# Patient Record
Sex: Female | Born: 1940 | Race: White | Hispanic: No | Marital: Married | Smoking: Never smoker
Health system: Southern US, Community
[De-identification: ages and names within clinical notes are randomized; demographics above are authoritative.]

## PROBLEM LIST (undated history)

## (undated) DIAGNOSIS — R251 Tremor, unspecified: Secondary | ICD-10-CM

## (undated) DIAGNOSIS — I252 Old myocardial infarction: Secondary | ICD-10-CM

## (undated) DIAGNOSIS — I2510 Atherosclerotic heart disease of native coronary artery without angina pectoris: Secondary | ICD-10-CM

## (undated) DIAGNOSIS — E785 Hyperlipidemia, unspecified: Secondary | ICD-10-CM

## (undated) HISTORY — PX: TRIGGER FINGER RELEASE: SHX641

## (undated) HISTORY — PX: HX CATARACT REMOVAL: SHX102

## (undated) HISTORY — DX: Tremor, unspecified: R25.1

## (undated) HISTORY — DX: Old myocardial infarction: I25.2

## (undated) HISTORY — DX: Atherosclerotic heart disease of native coronary artery without angina pectoris: I25.10

## (undated) HISTORY — DX: Hyperlipidemia, unspecified: E78.5

---

## 2006-09-29 DIAGNOSIS — I2510 Atherosclerotic heart disease of native coronary artery without angina pectoris: Secondary | ICD-10-CM

## 2006-09-29 HISTORY — DX: Atherosclerotic heart disease of native coronary artery without angina pectoris: I25.10

## 2007-04-30 HISTORY — PX: LEFT HEART CATH: SHX5946

## 2007-05-13 ENCOUNTER — Inpatient Hospital Stay (HOSPITAL_COMMUNITY): Admission: EM | Admit: 2007-05-13 | Discharge: 2007-05-15 | Payer: Self-pay | Admitting: Cardiology

## 2007-05-13 ENCOUNTER — Ambulatory Visit: Payer: Self-pay | Admitting: *Deleted

## 2007-06-10 ENCOUNTER — Ambulatory Visit: Payer: Self-pay | Admitting: Cardiology

## 2007-07-06 ENCOUNTER — Ambulatory Visit: Payer: Self-pay | Admitting: Cardiovascular Disease

## 2007-07-06 LAB — CONVERTED CEMR LAB
Bilirubin, Direct: 0.2 mg/dL (ref 0.0–0.3)
HDL: 46.1 mg/dL (ref >39.0)
LDL Cholesterol: 70 mg/dL (ref 0–99)
Total CHOL/HDL Ratio: 2.7
Total Protein: 6.4 g/dL (ref 6.0–8.3)
Triglycerides: 34 mg/dL (ref 0–149)
VLDL: 7 mg/dL (ref 0–40)

## 2008-01-13 ENCOUNTER — Ambulatory Visit: Payer: Self-pay | Admitting: Cardiovascular Disease

## 2008-02-09 IMAGING — CT CT ANGIO CHEST
2 of 7 series · 17 of 36 positions shown · IV contrast (omnipaque)
Comparison: none

CLINICAL DATA: 65-year-old female with chest pain and back pain. 
 CT ANGIOGRAPHY OF CHEST:
TECHNIQUE: Multidetector CT imaging of the chest was performed during bolus injection of intravenous contrast.  Multiplanar CT angiographic image reconstructions were generated to evaluate the vascular anatomy.
 Contrast:  70 mL Omnipaque 300.

[Series 6: pulm embolism 2.0 cor · coronal · 0.64mm/px · 1 of 98 slices shown]
[im 49/98  mediastinal]
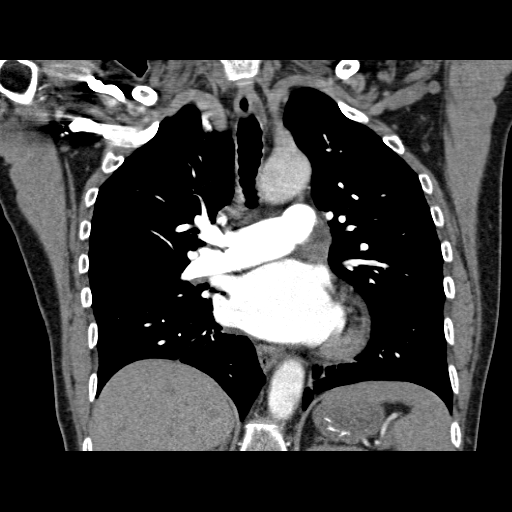

[Series 10: pulm embolism 1.0 thins · axial · 0.55mm/px · z∈[-262,-40]mm · 16 of 253 slices shown]
[im 15/253  lung]
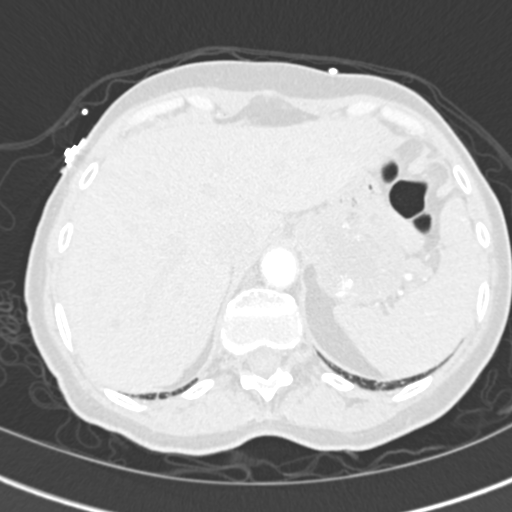
[im 30/253  mediastinal]
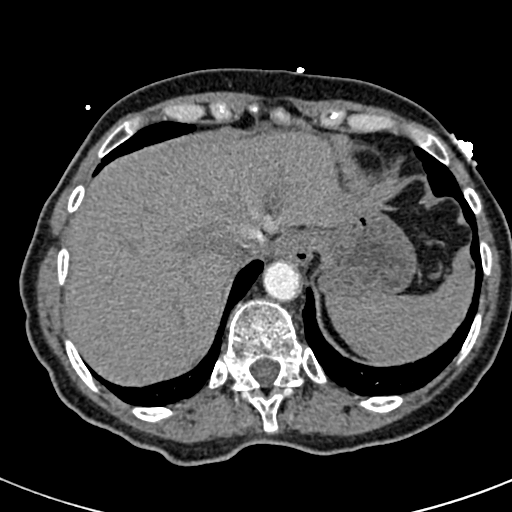
[im 45/253  lung]
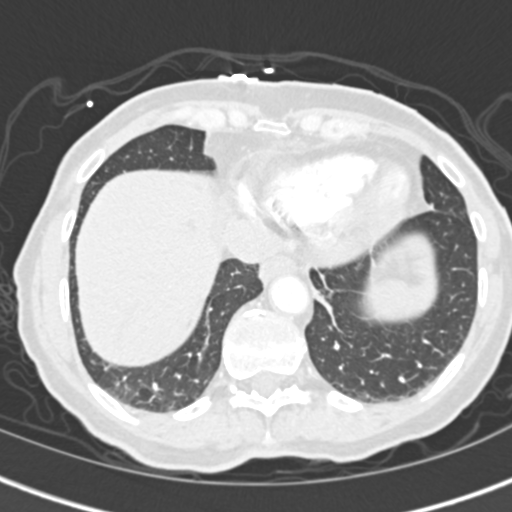
[im 60/253  mediastinal]
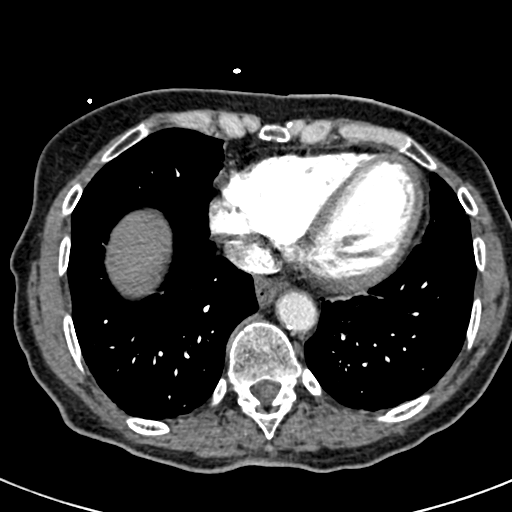
[im 75/253  lung]
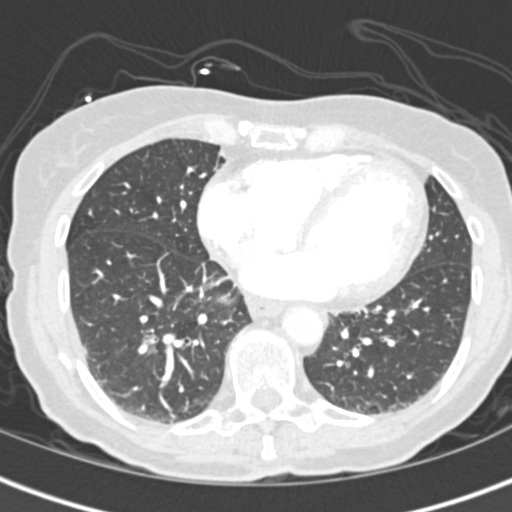
[im 89/253  mediastinal]
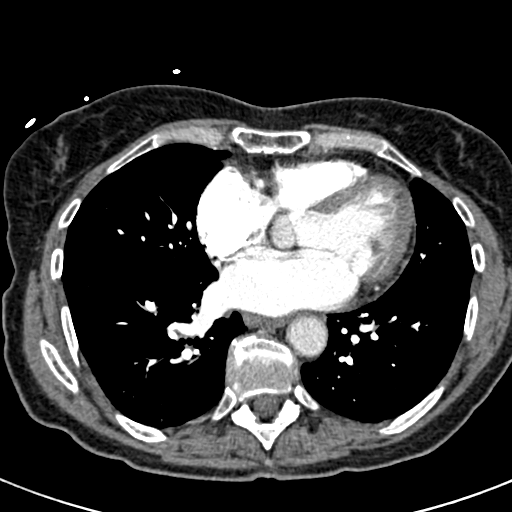
[im 104/253  lung]
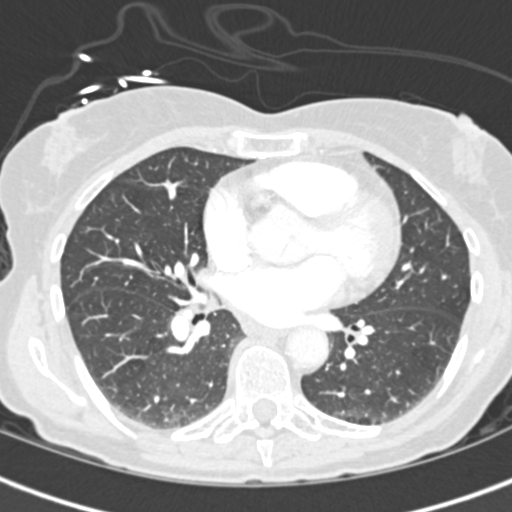
[im 119/253  mediastinal]
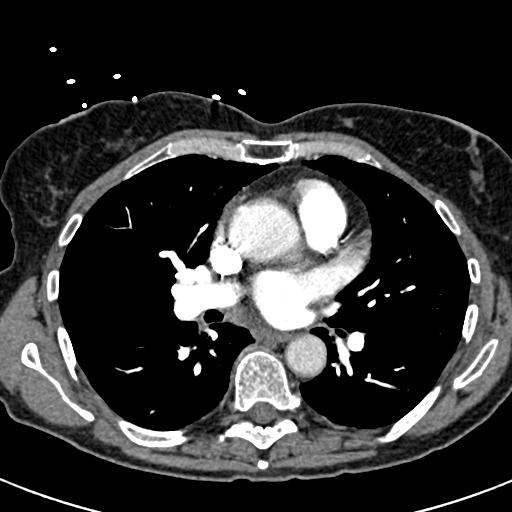
[im 134/253  lung]
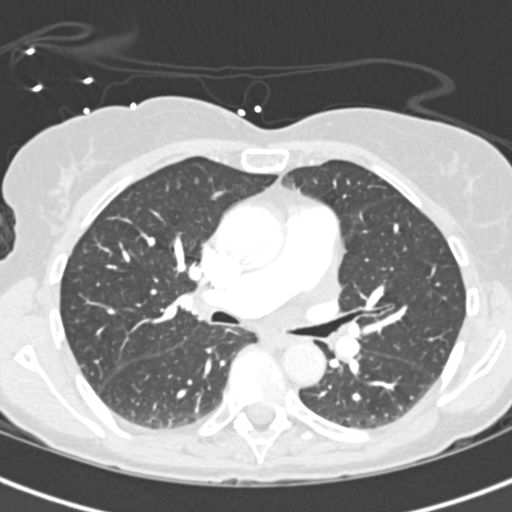
[im 149/253  mediastinal]
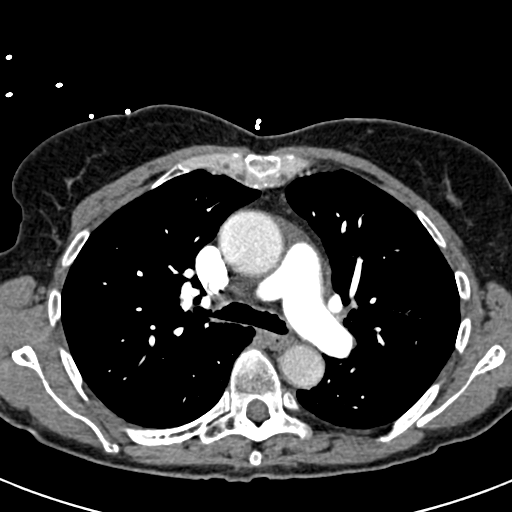
[im 164/253  lung]
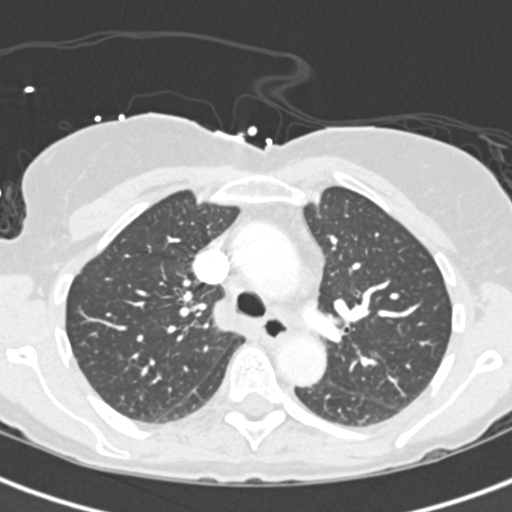
[im 178/253  mediastinal]
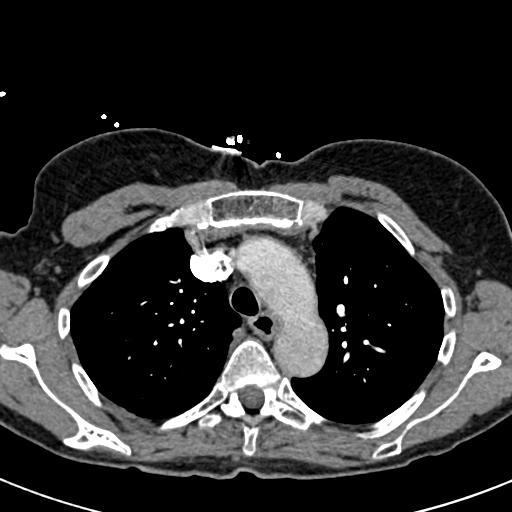
[im 193/253  lung]
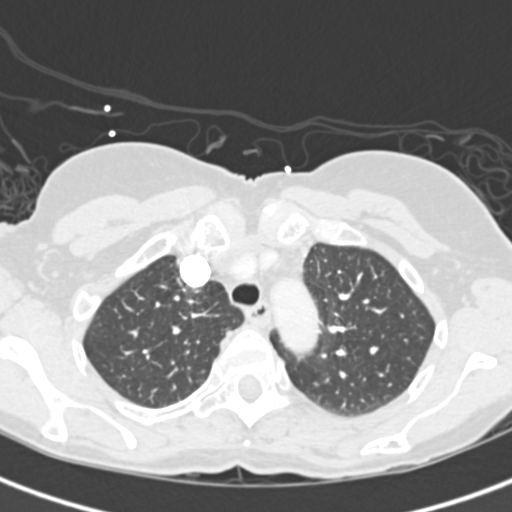
[im 208/253  mediastinal]
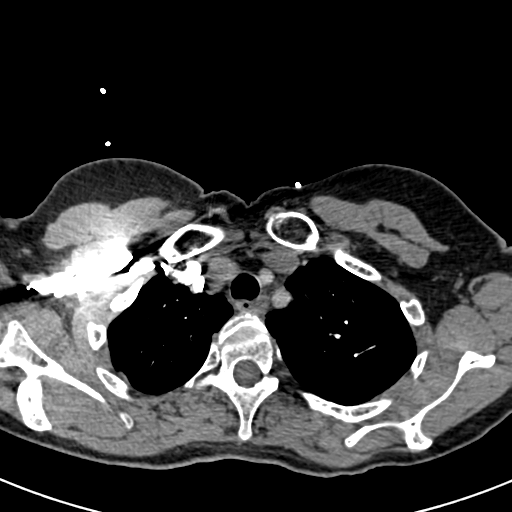
[im 223/253  lung]
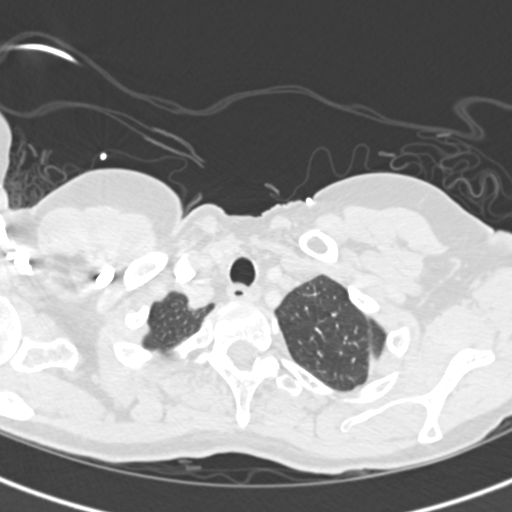
[im 238/253  mediastinal]
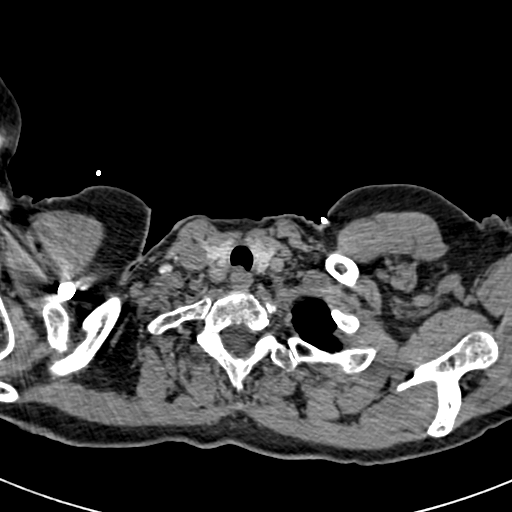

[17 of 36 positions shown; findings below may reference images not displayed]

FINDINGS: There is excellent opacification of the pulmonary arterial system. No focal filling defects are present to suggest pulmonary emboli. The aorta is unremarkable.  The heart size is normal. There is no significant pleural or pericardial effusion. Incidental imaging of the upper abdomen demonstrates two cystic lesions within the right lobe of the liver. No other focal abnormalities are present.
 Lung Windows:  A 3 mm left upper lobe nodule is present. Minimal dependent atelectasis is present. There are no other focal nodules, mass lesions, or airspace disease.
 Bone windows demonstrate no focal lytic or blastic lesions.
IMPRESSION: 1.  No pulmonary embolus.
 2.  3 mm left upper lobe nodule. No further follow-up is necessary. 
 3.  Benign appearing cystic lesions of the right lobe of the liver.

## 2008-07-26 ENCOUNTER — Ambulatory Visit: Payer: Self-pay | Admitting: Cardiovascular Disease

## 2008-07-26 LAB — CONVERTED CEMR LAB
ALT: 16 units/L (ref 0–35)
AST: 28 units/L (ref 0–37)
Cholesterol: 163 mg/dL (ref 0–200)
HDL: 58.4 mg/dL (ref >39.0)
Total Protein: 6.5 g/dL (ref 6.0–8.3)
Triglycerides: 51 mg/dL (ref 0–149)
VLDL: 10 mg/dL (ref 0–40)

## 2009-02-16 ENCOUNTER — Telehealth: Payer: Self-pay | Admitting: Cardiovascular Disease

## 2009-07-26 DIAGNOSIS — I252 Old myocardial infarction: Secondary | ICD-10-CM

## 2009-07-26 DIAGNOSIS — M810 Age-related osteoporosis without current pathological fracture: Secondary | ICD-10-CM | POA: Insufficient documentation

## 2009-07-26 DIAGNOSIS — E785 Hyperlipidemia, unspecified: Secondary | ICD-10-CM

## 2009-07-26 DIAGNOSIS — I2510 Atherosclerotic heart disease of native coronary artery without angina pectoris: Secondary | ICD-10-CM | POA: Insufficient documentation

## 2009-07-31 ENCOUNTER — Ambulatory Visit: Payer: Self-pay | Admitting: Cardiovascular Disease

## 2009-08-16 ENCOUNTER — Ambulatory Visit: Payer: Self-pay | Admitting: Cardiovascular Disease

## 2009-08-29 LAB — CONVERTED CEMR LAB
Bilirubin, Direct: 0 mg/dL (ref 0.0–0.3)
Cholesterol: 139 mg/dL (ref 0–200)
HDL: 50.6 mg/dL (ref >39.00)
LDL Cholesterol: 80 mg/dL (ref 0–99)
Total Bilirubin: 0.7 mg/dL (ref 0.3–1.2)
Total CHOL/HDL Ratio: 3
Total Protein: 6.5 g/dL (ref 6.0–8.3)
Triglycerides: 43 mg/dL (ref 0.0–149.0)

## 2009-11-29 ENCOUNTER — Telehealth (INDEPENDENT_AMBULATORY_CARE_PROVIDER_SITE_OTHER): Payer: Self-pay | Admitting: *Deleted

## 2010-02-19 ENCOUNTER — Telehealth (INDEPENDENT_AMBULATORY_CARE_PROVIDER_SITE_OTHER): Payer: Self-pay | Admitting: *Deleted

## 2010-04-22 ENCOUNTER — Telehealth: Payer: Self-pay | Admitting: Cardiovascular Disease

## 2010-09-10 ENCOUNTER — Ambulatory Visit: Payer: Self-pay | Admitting: Cardiovascular Disease

## 2010-09-10 ENCOUNTER — Encounter: Payer: Self-pay | Admitting: Cardiovascular Disease

## 2010-09-17 ENCOUNTER — Ambulatory Visit: Payer: Self-pay | Admitting: Cardiovascular Disease

## 2010-09-27 DIAGNOSIS — E7800 Pure hypercholesterolemia, unspecified: Secondary | ICD-10-CM

## 2010-09-30 LAB — CONVERTED CEMR LAB
ALT: 13 units/L (ref 0–35)
Bilirubin, Direct: 0.1 mg/dL (ref 0.0–0.3)
HDL: 48 mg/dL (ref >39.00)
Total Bilirubin: 0.7 mg/dL (ref 0.3–1.2)
Total CHOL/HDL Ratio: 3
VLDL: 7.2 mg/dL (ref 0.0–40.0)

## 2010-10-01 ENCOUNTER — Encounter: Payer: Self-pay | Admitting: Cardiovascular Disease

## 2010-10-29 NOTE — Progress Notes (Signed)
Summary: questions re medication   Phone Note Call from Patient   Caller: Patient Reason for Call: Talk to Nurse Summary of Call: pt wants to know if there is a problem with taking the simvastatin and drinking grapefruit juice-pls call [phone] Initial call taken by: Glynda Jaeger,  April 22, 2010 11:08 AM  Follow-up for Phone Call        I made the pt aware that there is an interaction between grapefruit juice and simvastatin.  I told the pt that you cannot take simvastatin at the same time as drinking grapefruit juice.  The pt also cannot consume more than 1 quart of grapefruit juice in a day.   Follow-up by: Julieta Gutting, RN, BSN,  April 22, 2010 4:41 PM

## 2010-10-29 NOTE — Progress Notes (Signed)
Summary: refill**[facility]**  Phone Note Refill Request Message from:  Patient on Feb 19, 2010 1:29 PM  Refills Requested: Medication #1:  SIMVASTATIN 40 MG TABS Take one tablet by mouth daily at bedtime   Supply Requested: 3 months   Notes: 90 day supply [facility] [phone] fax [phone]   Method Requested: Fax to Mail Away Pharmacy Initial call taken by: Migdalia Dk,  Feb 19, 2010 1:30 PM  Follow-up for Phone Call        Rx faxed to pharmacy Follow-up by: Vikki Ports,  Feb 19, 2010 3:23 PM    Prescriptions: SIMVASTATIN 40 MG TABS (SIMVASTATIN) Take one tablet by mouth daily at bedtime  #90 Tablet x 4   Entered by:   Vikki Ports   Authorized by:   Norva Karvonen, MD   Signed by:   Vikki Ports on 02/19/2010   Method used:   Faxed to ...       [facility] (mail[facility]order)             , [place]         Ph:        Fax: [phone]   RxID:   [id number]

## 2010-10-29 NOTE — Progress Notes (Signed)
Summary: REFILL  Phone Note Refill Request Message from:  Patient on November 29, 2009 10:30 AM  Refills Requested: Medication #1:  METOPROLOL TARTRATE 25 MG TABS take 1/2 tablet two times a day [facility] [phone] FAX # [phone]  Initial call taken by: Judie Grieve,  November 29, 2009 10:31 AM    Prescriptions: METOPROLOL TARTRATE 25 MG TABS (METOPROLOL TARTRATE) take 1/2 tablet two times a day  #90 x 3   Entered by:   Oswald Hillock   Authorized by:   Norva Karvonen, MD   Signed by:   Oswald Hillock on 11/29/2009   Method used:   Faxed to ...       [facility] (mail-order)       [address]       [place], [place]  [address]       Ph: [phone]       Fax: [phone]   RxID:   [id number]

## 2010-10-31 NOTE — Letter (Signed)
Summary: Custom - Lipid  [facility], Main Office  [address]   [place], [place] [address]   Phone: [phone]  Fax: [phone]     October 01, 2010 MRN: [id number]   Andrea Frost [address] [place], [place]  [address]   Dear Andrea Frost,  We have reviewed your cholesterol results.  They are as follows:     Total Cholesterol:    133 (Desirable: less than 200)       HDL  Cholesterol:     48.00  (Desirable: greater than 40 for men and 50 for women)       LDL Cholesterol:       78  (Desirable: less than 100 for low risk and less than 70 for moderate to high risk)       Triglycerides:       36.0  (Desirable: less than 150)  Our recommendations include: Your cholesterol and liver function results are normal.  Please continue your current medications.   Call our office at the number listed above if you have any questions.  Lowering your LDL cholesterol is important, but it is only one of a large number of "risk factors" that may indicate that you are at risk for heart disease, stroke or other complications of hardening of the arteries.  Other risk factors include:   A.  Cigarette Smoking* B.  High Blood Pressure* C.  Obesity* D.   Low HDL Cholesterol (see yours above)* E.   Diabetes Mellitus (higher risk if your is uncontrolled) F.  Family history of premature heart disease G.  Previous history of stroke or cardiovascular disease    *These are risk factors YOU HAVE CONTROL OVER.  For more information, visit .  There is now evidence that lowering the TOTAL CHOLESTEROL AND LDL CHOLESTEROL can reduce the risk of heart disease.  The American Heart Association recommends the following guidelines for the treatment of elevated cholesterol:  1.  If there is now current heart disease and less than two risk factors, TOTAL CHOLESTEROL should be less than 200 and LDL CHOLESTEROL should be less than 100. 2.  If there is current heart disease or two or more  risk factors, TOTAL CHOLESTEROL should be less than 200 and LDL CHOLESTEROL should be less than 70.  A diet low in cholesterol, saturated fat, and calories is the cornerstone of treatment for elevated cholesterol.  Cessation of smoking and exercise are also important in the management of elevated cholesterol and preventing vascular disease.  Studies have shown that 30 to 60 minutes of physical activity most days can help lower blood pressure, lower cholesterol, and keep your weight at a healthy level.  Drug therapy is used when cholesterol levels do not respond to therapeutic lifestyle changes (smoking cessation, diet, and exercise) and remains unacceptably high.  If medication is started, it is important to have you levels checked periodically to evaluate the need for further treatment options.  Thank you,  Julieta Gutting RN [facility] Team

## 2010-10-31 NOTE — Assessment & Plan Note (Signed)
Summary: yearly   Visit Type:  1 year follow up  CC:  No cardiac complaints.  History of Present Illness: This is a 70 year old woman who presented in 2008 with an acute coronary syndrome. She was found to have nonobstructive CAD a cardiac catheterization was treated medically.   She has no complaints today. Specifically denies chest pain, dyspnea, edema, palpitations, lightheadedness, or syncope. She walks her dog for exercise at least a few times daily without exertional symptoms.   Current Medications (verified): 1)  Metoprolol Tartrate 25 Mg Tabs (Metoprolol Tartrate) .... Take 1/2 Tablet Two Times A Day 2)  Aspirin Ec 325 Mg Tbec (Aspirin) .... Take One Tablet By Mouth Daily 3)  Simvastatin 40 Mg Tabs (Simvastatin) .... Take One Tablet By Mouth Daily At Bedtime 4)  Calcium Carbonate-Vitamin D 600-400 Mg-Unit  Tabs (Calcium Carbonate-Vitamin D) .... Take 1 Tablet By Mouth Two Times A Day 5)  Nitrostat 0.4 Mg Subl (Nitroglycerin) .Marland Kitchen.. 1 Tablet Under Tongue At Onset of Chest Pain; You May Repeat Every 5 Minutes For Up To 3 Doses. 6)  Alendronate Sodium 70 Mg Tabs (Alendronate Sodium) .... Once A Week 7)  Triamcinolone Acetonide 0.1 % Crea (Triamcinolone Acetonide) .... Use As Directed Daily  Allergies: 1)  ! * Epinephrine  Past History:  Past medical history reviewed for relevance to current acute and chronic problems.  Past Medical History: Reviewed history from 07/31/2009 and no changes required. Nonobstructive CAD, but presentation with ACS in 2008. Dyslipidemia Osteoporosis  Review of Systems       Negative except as per HPI   Vital Signs:  Patient profile:   70 year old female Height:      66 inches Weight:      136 pounds BMI:     22.03 Pulse rate:   59 / minute Pulse rhythm:   regular Resp:     18 per minute BP sitting:   114 / 74  (left arm) Cuff size:   regular  Vitals Entered By: Vikki Ports (September 10, 2010 1:59 PM)  Physical Exam  General:  Pt  is alert and oriented, in no acute distress. HEENT: normal Neck: normal carotid upstrokes without bruits, JVP normal Lungs: CTA CV: RRR without murmur or gallop Abd: soft, NT, positive BS, no bruit, no organomegaly Ext: no clubbing, cyanosis, or edema. peripheral pulses 2+ and equal Skin: warm and dry without rash    EKG  Procedure date:  09/10/2010  Findings:      NSR with nonspecific ST-T changes, 59 bpm  Impression & Recommendations:  Problem # 1:  CAD (ICD-414.00)  No recurrent angina. Continue ASA, beta-blocker, and statin. Encouraged continued exercise.  Her updated medication list for this problem includes:    Metoprolol Tartrate 25 Mg Tabs (Metoprolol tartrate) .Marland Kitchen... Take 1/2 tablet two times a day    Aspirin Ec 325 Mg Tbec (Aspirin) .Marland Kitchen... Take one tablet by mouth daily    Nitrostat 0.4 Mg Subl (Nitroglycerin) .Marland Kitchen... 1 tablet under tongue at onset of chest pain; you may repeat every 5 minutes for up to 3 doses.  Orders: EKG w/ Interpretation (93000)  Problem # 2:  DYSLIPIDEMIA (ICD-272.4) Lipids have been at goal. Due for followup labs - will order.  Her updated medication list for this problem includes:    Simvastatin 40 Mg Tabs (Simvastatin) .Marland Kitchen... Take one tablet by mouth daily at bedtime  CHOL: 139 (08/16/2009)   LDL: 80 (08/16/2009)   HDL: 50.60 (08/16/2009)   TG: 43.0 (08/16/2009)  Patient Instructions: 1)  Your physician recommends that you schedule a follow-up appointment in: 12 months 2)  Your physician recommends that you return for a FASTING lipid profile and liver profile next week 3)  Your physician recommends that you continue on your current medications as directed. Please refer to the Current Medication list given to you today.

## 2010-12-03 ENCOUNTER — Telehealth: Payer: Self-pay | Admitting: Cardiovascular Disease

## 2010-12-10 NOTE — Progress Notes (Signed)
Summary: Metoprolol Tartrate 25mg   Phone Note Refill Request Call back at Home Phone [phone] Message from:  Patient  Refills Requested: Medication #1:  METOPROLOL TARTRATE 25 MG TABS take 1/2 tablet two times a day please send in a 90day supply [facility]  Initial call taken by: Omer Jack,  December 03, 2010 3:59 PM    Prescriptions: METOPROLOL TARTRATE 25 MG TABS (METOPROLOL TARTRATE) take 1/2 tablet two times a day  #90 x 3   Entered by:   Celestia Khat, CMA   Authorized by:   Norva Karvonen, MD   Signed by:   Celestia Khat, CMA on 12/03/2010   Method used:   Faxed to ...       [facility] (mail-order)             , [place]         Ph:        Fax: [phone]   RxID:   [id number]

## 2011-02-11 NOTE — Cardiovascular Report (Signed)
NAME:  Andrea Frost, POSNER NO.:  [id number]   MEDICAL RECORD NO.:  [id number]          PATIENT TYPE:  INP   LOCATION:  3706                         FACILITY:  [facility]   PHYSICIAN:  Veverly Fells. Excell Seltzer, MD  DATE OF BIRTH:  [birth year]   DATE OF PROCEDURE:  05/14/2007  DATE OF DISCHARGE:  05/15/2007                            CARDIAC CATHETERIZATION   PROCEDURE:  1. Left heart catheterization.  2. Selective coronary angiography.  3. Left ventricular angiography.  4. StarClose of the right femoral artery.   INDICATIONS:  Andrea Frost is a 70 year old woman who presented with  chest and back pain.  She has not had any cardiac history or problems in  the past.  She was given nitroglycerin with relief of her symptoms.  She  had increased D-dimer as well as an increased troponin of 0.4.  She was  ruled out for pulmonary embolus with a CTPE protocol.  She is referred  for cardiac catheterization in the setting of her small cardiac  biomarker rise.  She had been painfree since her initial presentation.   DESCRIPTION OF PROCEDURE:  Risks and indications of procedure were  explained to the patient.  Informed consent was obtained.  The right  groin was prepped, draped, anesthetized with 1% lidocaine sent lidocaine  using modified Seldinger technique.  A 6-French sheath was placed in the  right femoral artery.  Multiple views of the right and left coronary  arteries were taken.  Following selective coronary angiography, an  angled pigtail catheter was inserted and left ventricle and pressures  were recorded.  Left ventriculogram was performed.  A pull-back across  the aortic valve was done at the completion of the procedure.  StarClose  device was used to seal the femoral arteriotomy.   FINDINGS:  Aortic pressure is 128/60 with a mean of 88.  Left  ventricular pressure is 125/70   CORONARY ANGIOGRAPHY:  The left mainstem is mildly calcified,  angiographically normal.  It  bifurcates into the LAD and left  circumflex.  The LAD has minor ostial stenosis.  There is some  calcification throughout the proximal LAD as well.  The estimated  stenosis is 30-40%.  The LAD is a small vessel after its mid portion.  There is a twin that divides into two vessels that has essentially  represented twin LAD that reaches the mid and mid-to-distal portion of  the anterior wall.  There is a first diagonal branch that is also a  small vessel.   The left circumflex is a large vessel.  There are minor luminal  irregularities in the proximal portion.  It courses down and provides a  medium-size first OM branch and then three small posterolateral  branches.  There is no significant angiographic disease in the left  circumflex system.  The right coronary artery is a large vessel.  It is  dominant and reaches the left ventricular apex.  The proximal portion of  the vessel has minor luminal irregularities of approximately 30%.  The  mid portion of the vessel is angiographically normal and distally there  is a focal 50% lesion.  The entire right coronary artery has mild  diffuse calcium.  The PDA branch has no significant angiographic  disease.  There is a very small posterior AV segment branch.    Left ventriculography shows normal left ventricular systolic function  with an left ventricular ejection fraction estimated at 55%.   ASSESSMENT:  1. Minor nonobstructive left anterior descending stenosis.  2. Mild to moderate right coronary artery stenosis.  3. Normal left circumflex.  4. Normal left ventricular function.   PLAN:  Andrea Frost does not have any high-grade stenoses.  Recommend  aggressive medical therapy with aspirin and lipid lowering.  Will start  her on 40 mg of Lipitor daily.  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  If she has any  recurrence, I would favor treating her as such with a calcium channel   blocker.      Veverly Fells. Excell Seltzer, MD  Electronically Signed     MDC/MEDQ  D:  05/14/2007  T:  05/15/2007  Job:  [id number]

## 2011-02-11 NOTE — Assessment & Plan Note (Signed)
[facility]                            CARDIOLOGY OFFICE NOTE   HILA, BOLDING                     MRN:          [id number]  DATE:07/06/2007                            DOB:          [birth year]    Andrea Frost returns for cardiology followup at the [facility] office  on July 06, 2007. She is a delightful 70 year old woman who presented  in mid August with a non-ST elevation myocardial infarction. She also  had an elevated d-Dimer at that time and a CT scan of the chest was  performed which was negative for pulmonary embolus. She ultimately  underwent a left heart catheterization with coronary angiography that  demonstrated non-obstructive coronary artery disease. She had non-  obstructive disease of the LAD with 30-40% stenosis through the proximal  vessel. The left circumflex had minor luminal irregularities and the  right coronary artery had mild diffuse disease as well. Her LV EF was  normal at 55%. She has responded well to medical therapy and is  currently asymptomatic. She specifically denies chest pain, dyspnea,  palpitations or lightheadedness.   CURRENT MEDICATIONS:  1. Aspirin 325 mg daily.  2. Lipitor 40 mg daily.  3. Toprol XL 25 mg daily.  4. Actonel daily.   PHYSICAL EXAMINATION:  She is alert, oriented and in no acute distress.  She is a healthy-appearing woman. Weight is 128, blood pressure 116/68,  heart rate 60, respiratory rate is 12.  HEENT: Is normal.  NECK: Normal carotid upstrokes without bruits. Jugular venous pressure  is normal.  LUNGS:  Clear to auscultation bilaterally.  CARDIOVASCULAR: The heart is regular rate and rhythm without murmurs or  gallops.  ABDOMEN: Soft and nontender. No organomegaly. No abdominal bruits.  EXTREMITIES: No clubbing, cyanosis or edema. Peripheral pulses are 2+  and equal throughout.   ASSESSMENT:  1. Non-obstructive coronary artery disease with recent non-ST      elevation  myocardial infarction. Continue with medical therapy. She      is tolerating her current regimen with no side effects. Long      discussion today regarding longterm secondary risk reduction and      rationale for ongoing medical therapy with statin, aspirin and beta-      blockade.  2. Hyperlipidemia. Will followup with lipids and LFTs. She is      scheduled to have labs drawn today.  3. Osteoporosis. She is followed by her primary care physician and      continues with Actonel.   For followup, I would like to see Andrea Frost back in six months. I  would be happy to see her back sooner if any problems arise.     Veverly Fells. Excell Seltzer, MD  Electronically Signed    MDC/MedQ  DD: 07/06/2007  DT: 07/06/2007  Job #: [id number]

## 2011-02-11 NOTE — Assessment & Plan Note (Signed)
[facility]                            CARDIOLOGY OFFICE NOTE   Andrea Frost, Andrea Frost                     MRN:          [id number]  DATE:01/13/2008                            DOB:          [birth year]    HISTORY:  Andrea Frost was seen in followup at the [facility] office on January 13, 2008.  She is a delightful 70 year old  woman who presented with a non-ST-elevation MI last summer.  Catheterization showed nonobstructive CAD with preserved LVEF of 55%.  She has continued to be asymptomatic.  She specifically denies chest  pain, dyspnea, palpitations, lightheadedness or syncope.  She has been  active and walks approximately 30 minutes daily with her dog.   MEDICATIONS:  1. Aspirin 325 mg daily.  2. Actonel 150 mg monthly.  3. Simvastatin 40 mg daily.  4. Metoprolol 12.5 mg twice daily.  5. Calcium Plus D 600 mg twice daily.   PHYSICAL EXAMINATION:  GENERAL:  She is alert and oriented in no  distress.  VITAL SIGNS:  Weight 138 pounds, blood pressure 123/75, heart rate 63,  respiratory rate 16.  HEENT:  Normal.  NECK:  Normal carotid upstrokes without bruits.  Jugular venous pressure  is normal.  LUNGS:  Clear bilaterally.  HEART:  Regular rate and rhythm without murmurs or gallops.  ABDOMEN:  Soft, nontender.  No organomegaly.  No bruits.  EXTREMITIES:  No clubbing, cyanosis or edema.  Peripheral pulses are 2+  and equal throughout.   DIAGNOSTICS:  EKG shows sinus rhythm with inferior ST/T wave changes  consistent with ischemia and also nonspecific anterolateral ST/T wave  changes.  EKG is unchanged from previous.   ASSESSMENT:  1. History of non-ST elevation myocardial infarction with mild      nonobstructive coronary artery disease.  Continue medical therapy      with aspirin, simvastatin and metoprolol.  No symptoms of angina at      present.  2. Dyslipidemia.  Lipids are under excellent control on simvastatin.      In  October 2008,  the cholesterol was 123, HDL 46, LDL 70.      Continue current therapy with repeat labs in October 2009.   FOLLOW UP:  I would like to see Andrea Frost back in 1 year.  I would be  happy to see her sooner if any problems arise.     Veverly Fells. Excell Seltzer, MD  Electronically Signed    MDC/MedQ  DD: 01/13/2008  DT: 01/13/2008  Job #: [id number]

## 2011-02-11 NOTE — Assessment & Plan Note (Signed)
[facility]                            CARDIOLOGY OFFICE NOTE   Andrea Frost, Andrea Frost                     MRN:          [id number]  DATE:06/10/2007                            DOB:          [birth year]    PRIMARY CARDIOLOGIST:  Veverly Fells. Excell Seltzer, M.D.   PATIENT PROFILE:  A 70 year old Caucasian female with recent non-ST-  elevation MI and nonobstructive coronary disease on catheterization who  presents for hospital followup.   PROBLEM LIST:  1. Coronary artery disease.      a.     Status post non-ST-elevation myocardial infarction May 13, 2007.      b.     May 14, 2007, cardiac catheterization revealing       nonobstructive disease with an ejection fraction of 55%. The       patient was medically managed.  2. Hyperlipidemia.  3. History of 3-mm left upper lobe nodule noted on chest CT during      hospitalization August 2008.  4. Osteoporosis.   HISTORY OF PRESENT ILLNESS:  A 70 year old Caucasian female who  presented to [facility] August 14 complaining of back and chest pain.  She ruled in for non-ST-elevation MI. She also had elevated D-dimer with  negative chest CT for PE. Secondary to elevated cardiac markers, she  underwent cardiac catheterization August 15 which showed nonobstructive  coronary artery disease with normal LV function. She was subsequently  discharge August 16 on aspirin, statin and beta blocker therapy, all of  which she has tolerated. She has been active, walking approximately 30  minutes a day, without any recurrent chest discomfort or limitations in  activities. She denies any dyspnea. She had multiple questions regarding  the usage of statin therapy as well as management of a patient with non-  ST-elevation MI and nonobstructive disease.   HOME MEDICATIONS:  1. Aspirin 325 mg daily.  2. Lipitor 40 mg daily.  3. Toprol-XL 25 mg daily.  4. Actonel every week.  5. Nitroglycerin p.r.n.   PHYSICAL  EXAMINATION:  Blood pressure 102/68, heart rate 59,  respirations 16. She is afebrile. Weight is 134 pounds.  Pleasant white female in no acute distress. Awake, alert and oriented  x3.  NECK:  No bruits or JVD.  LUNGS:  Respirations are regular and unlabored. Clear to auscultation.  CARDIAC:  Regular S1 and S2. No S3, S4 or murmurs.  ABDOMEN:  Round, soft, nontender, nondistended. Bowel sounds present x4.  EXTREMITIES:  Warm, dry and pink. No clubbing, cyanosis, or edema.  Dorsalis pedis and posterior tibialis pulses 2+ and equal bilaterally.  The right groin site which was previously used for catheterization is  clear of bleeding, bruising, hematoma.   ACCESSORY CLINICAL FINDINGS:  None.   ASSESSMENT AND PLAN:  1. Nonobstructive coronary artery disease status post non-ST-elevation      myocardial infarction. The patient is doing well on medical      management. She is tolerating her aspirin, beta blocker and statin      just fine. She is encouraged to continue taking her medications,  and we discussed indications for beta blocker, statin and aspirin      following non-ST-elevation myocardial infarction. She has followup      with Dr. Excell Seltzer scheduled for October 7, and as the statin is new      for her, we will plan to check LFTs at that appointment.  2. Hyperlipidemia. Follow up lipids and LFTs in October. Continue      statin therapy.  3. Osteoporosis. Followed by primary care. She is Actonel therapy.   DISPOSITION:  Follow up with Dr. Excell Seltzer next month.      Nicolasa Ducking, ANP  Electronically Signed      Arturo Morton. Riley Kill, MD, [facility]  Electronically Signed   CB/MedQ  DD: 06/10/2007  DT: 06/11/2007  Job #: [id number]

## 2011-02-11 NOTE — H&P (Signed)
NAME:  Andrea Frost, Andrea Frost NO.:  [id number]   MEDICAL RECORD NO.:  [id number]          PATIENT TYPE:  INP   LOCATION:  6531                         FACILITY:  [facility]   PHYSICIAN:  Elmore Guise., M.D.DATE OF BIRTH:  [birth year]   DATE OF ADMISSION:  05/13/2007  DATE OF DISCHARGE:                              HISTORY & PHYSICAL   INDICATION FOR ADMISSION:  Unstable angina.   HISTORY OF PRESENT ILLNESS:  The patient is a very pleasant 70 year old  white female with past medical history of osteoporosis, who presented to  the local ER with back and chest pain.  The patient reports that she is  normally very healthy.  She dos all her normal activities without any  significant chest pain or shortness of breath.  She walks up to 30  minutes a day and did so earlier in the day without any problems.  This  evening, the patient reported sharp pain between her shoulder blades  that continued and radiated to her sternal area, then increasing to a  tightness.  This was associated with nausea.  Her pain continued until  she felt uncomfortable and then went to the local ER.  She denied any  vomiting, diaphoresis, or shortness of breath.  Once she arrived at her  local ER, she was given nitroglycerin with total relief of her  symptoms.  There, she had initial blood work showing a mildly elevated  troponin of 0.4 and an increased D-DIMER of 1.1.  The patient is now  sent for cardiac evaluation.  She is currently chest pain free.  She  states overall she has been very healthy.  Her primary care physician is  her OB/GYN doctor, Dr. Wylene Simmer at [facility].   REVIEW OF SYSTEMS:  Are otherwise negative.  She specifically denies any  fever, cough, orthopnea, PND, palpitations, etc.  She has had no  episodes of immobility or long trips recently.  She also denies any sick  contacts.   CURRENT MEDICATIONS:  Aspirin and Actonel.   ALLERGIES:  EPINEPHRINE CAUSING JITTERS.   FAMILY HISTORY:  Negative for early heart disease or sudden cardiac  death.   SOCIAL HISTORY:  She is married.  No tobacco, no alcohol.  She currently  lives in [place] with her husband.   PHYSICAL EXAMINATION:  VITAL SIGNS:  She is afebrile, blood pressure  138/73, heart rate 56, showing sinus bradycardia on telemetry  monitoring.  She is satting 100% on room air.  GENERAL:  She is a very pleasant middle aged white female, alert and  oriented times four, in no acute distress.  HEENT: Appeared normal.  NECK:  Supple.  No lymphadenopathy.  2+ carotids.  No JVD, no bruits.  LUNGS:  Clear.  HEART:  Regular with normal S1, S2.  No significant murmurs, rubs or  gallops.  ABDOMEN:  Soft, non tender, non distended.  No rebound or guarding.  EXTREMITIES:  Warm with 2+ pulses and no edema.  Pulses are 2+ and equal  in the carotids, brachial, radial, pedal.   LABORATORY DATA:  Her blood work showed a D-DIMER of 1.1,  myoglobin 132,  troponin-I of 0.4, CPK MB of 7.7.  she has a BUN and creatinine of 15  and 0.9, potassium level 3.5.  White count 6.2 with hemoglobin of 12.6  and platelet count 157.  Her ECG shows sinus bradycardia, rate of 51 per  minute with nonspecific ST-T wave changes (there is mild flattening with  T-wave changes in the inferolateral leads, very nonspecific).   Chest x-ray by report showed no acute cardiopulmonary disease.  Digital  film not available at the time of dictation.   IMPRESSION:  1. Chest pain, currently chest pain free.  However, symptoms      consistent with unstable angina.  However, with elevated D-DIMER,      need to rule out pulmonary embolism.  2. Bradycardia.   PLAN:  The patient will be admitted to telemetry monitoring.  We will  continue serial cardiac enzymes.  I will replace her potassium.  She  will have CT of the chest to rule out PE and dissection tonight.  Otherwise, we will treat her with Lovenox 1 mg per kg subcutaneous times  one tonight,  aspirin.  We will hold on beta blocker for heart rate less  than 60, and check a fasting lipid panel in the morning.  If her enzymes  continue to go up, the patient likely will need cardiac catheterization  in the morning, and I discussed this with her and her husband at length.      Elmore Guise., M.D.  Electronically Signed     TWK/MEDQ  D:  05/14/2007  T:  05/14/2007  Job:  [id number]   cc:   Madolyn Frieze. Jens Som, MD, [facility]

## 2011-02-11 NOTE — Discharge Summary (Signed)
NAME:  PATRECIA, VEIGA NO.:  [id number]   MEDICAL RECORD NO.:  [id number]          PATIENT TYPE:  INP   LOCATION:  3706                         FACILITY:  [facility]   PHYSICIAN:  Rollene Rotunda, MD, FACCDATE OF BIRTH:  10-21-1940   DATE OF ADMISSION:  05/13/2007  DATE OF DISCHARGE:  05/15/2007                               DISCHARGE SUMMARY   .  In expectation [facility] D10 discharge summary for Dr. Rollene Rotunda on.   PRIMARY CARDIOLOGIST:  Veverly Fells. Excell Seltzer, MD   DISCHARGE DIAGNOSIS:  1. Coronary artery disease, non-ST-elevated myocardial infarction this      admission, status post cardiac catheterization which showed mild      nonobstructive left anterior descending stenosis, normal left      circumflex, mild to moderate right coronary artery stenosis, normal      left ventricular function, ejection fraction of 55%  2. Abnormal chest CT showing a 3 mm left upper lobe nodule. No      followup necessary with to benign-appearing cystic lesions of the      right lobe of liver, read by Dr. Marin Roberts.  3. Hypercholesteremia.   PAST MEDICAL HISTORY:  Includes osteoporosis.   HOSPITAL COURSE:  Ms. Curnow is a very pleasant 70 year old Caucasian  female with no known coronary artery disease who presented to [facility]  emergency room complaining of back and chest pain with increased  shortness of breath.  Initial blood work showed a troponin of 0.4 and a  D-dimer 1.1.  The patient underwent a chest CT negative for pulmonary  embolism.  However, noted to have a 3 mm left upper lobe nodule without  recommendation for followup at this time. The patient was admitted for  observation.  She ultimately ruled in for non-ST-elevated MI, troponin  of 1.44. It was decided to proceed with cardiac catheterization for  further evaluation of coronaries. Risks and benefits were discussed with  the patient.  The patient to the catheterization lab on April 13, 2007,  by Dr. Calton Dach. Results as stated above.  The patient tolerated  procedure without complications with recommendations for aggressive  medical therapy.   Dr. Antoine Poche in to see the patient on day of discharge. Afebrile, blood  pressure stable.  Catheterization site stable.  Patient being discharged  home with instructions to follow up with Dr. Excell Seltzer for post  catheterization check within the next 2 weeks.   MEDICATIONS AT TIME OF DISCHARGE:  1. Aspirin 325.  2. Lipitor 40.  3. Toprol XL 25.  4. Actonel as previously prescribed.  5. She has been given a prescription for nitroglycerin also for p.r.n.      use.   She also has been given the post cardiac catheterization discharge  instructions, post myocardial infarction medication guide.  Our office  to call the patient at home on Monday to arrange time and date of  appointment.   Duration of discharge encounter:  Greater than 30 minutes.      Dorian Pod, ACNP      Rollene Rotunda, MD, [facility]  Electronically Signed  MB/MEDQ  D:  05/15/2007  T:  05/16/2007  Job:  [id number]

## 2011-02-19 ENCOUNTER — Telehealth: Payer: Self-pay | Admitting: Cardiovascular Disease

## 2011-02-19 NOTE — Telephone Encounter (Signed)
Simvastatin refill needed at  [facility]

## 2011-02-20 MED ORDER — SIMVASTATIN 40 MG PO TABS: 40.0000 mg | ORAL_TABLET | Freq: Every day | ORAL | Status: DC

## 2011-02-20 NOTE — Telephone Encounter (Signed)
RX sent into pharmacy. Pt notified. 

## 2011-07-11 LAB — CBC
HCT: 33.8 — ABNORMAL LOW
HCT: 36.2
Hemoglobin: 11.6 — ABNORMAL LOW
Hemoglobin: 12.2
MCV: 89
Platelets: 147 — ABNORMAL LOW
RBC: 3.8 — ABNORMAL LOW
RBC: 4.07
RDW: 13.6
WBC: 5.2
WBC: 5.9

## 2011-07-11 LAB — LIPID PANEL
LDL Cholesterol: 127 — ABNORMAL HIGH
Total CHOL/HDL Ratio: 3.9
VLDL: 8

## 2011-07-11 LAB — CARDIAC PANEL(CRET KIN+CKTOT+MB+TROPI)
CK, MB: 8.9 — ABNORMAL HIGH
Relative Index: 5.3 — ABNORMAL HIGH
Relative Index: 5.6 — ABNORMAL HIGH
Total CK: 179 — ABNORMAL HIGH
Troponin I: 1.44

## 2011-07-11 LAB — BASIC METABOLIC PANEL
Chloride: 110
GFR calc Af Amer: >60
GFR calc non Af Amer: >60
Potassium: 3.9
Sodium: 139

## 2011-07-11 LAB — CK TOTAL AND CKMB (NOT AT ARMC)
CK, MB: 3.4
Relative Index: 3 — ABNORMAL HIGH

## 2011-07-11 LAB — PROTIME-INR
INR: 1.1
Prothrombin Time: 13.9

## 2011-07-14 LAB — BASIC METABOLIC PANEL
BUN: 15
GFR calc Af Amer: >60
GFR calc non Af Amer: >60
Potassium: 3.5

## 2011-07-14 LAB — D-DIMER, QUANTITATIVE: D-Dimer, Quant: 1.11 — ABNORMAL HIGH

## 2011-07-14 LAB — DIFFERENTIAL
Lymphocytes Relative: 28
Lymphs Abs: 1.7
Monocytes Relative: 6
Neutrophils Relative %: 63

## 2011-07-14 LAB — CBC
HCT: 37
Platelets: 157
RBC: 4.22
WBC: 6.2

## 2011-07-14 LAB — POCT CARDIAC MARKERS
CKMB, poc: 7.7
Operator id: 217071
Troponin i, poc: 0.4 — ABNORMAL HIGH

## 2011-10-24 ENCOUNTER — Encounter: Payer: Self-pay | Admitting: *Deleted

## 2011-10-28 ENCOUNTER — Ambulatory Visit (INDEPENDENT_AMBULATORY_CARE_PROVIDER_SITE_OTHER): Payer: Medicare Other | Admitting: Cardiovascular Disease

## 2011-10-28 ENCOUNTER — Encounter: Payer: Self-pay | Admitting: Cardiovascular Disease

## 2011-10-28 DIAGNOSIS — I2510 Atherosclerotic heart disease of native coronary artery without angina pectoris: Secondary | ICD-10-CM

## 2011-10-28 DIAGNOSIS — E785 Hyperlipidemia, unspecified: Secondary | ICD-10-CM

## 2011-10-28 DIAGNOSIS — E7800 Pure hypercholesterolemia, unspecified: Secondary | ICD-10-CM

## 2011-10-28 NOTE — Patient Instructions (Signed)
Your physician has recommended you make the following change in your medication: Please decrease Metoprolol to once a day for one week and then stop this medication  Your physician wants you to follow-up in: 1 YEAR.  You will receive a reminder letter in the mail two months in advance. If you don't receive a letter, please call our office to schedule the follow-up appointment.  Your physician recommends that you return for a FASTING LIPID, LIVER and BMP--nothing to eat or drink after midnight, lab opens at 8:30

## 2011-10-28 NOTE — Progress Notes (Signed)
HPI:  Ms. Holzman presents for followup evaluation. She is a 71 year old woman with a history of nonobstructive coronary artery disease. She presented in 2008 with an acute coronary syndrome, but there was no culprit lesion found to explain her clinical presentation.  The patient is doing well. She has no cardiovascular symptoms at the present time. She specifically denies chest pain or dyspnea. She has no palpitations, lightheadedness, or syncope. She continues to walk for exercise.  Outpatient Encounter Prescriptions as of 10/28/2011  Medication Sig Dispense Refill  . alendronate (FOSAMAX) 70 MG tablet Take 70 mg by mouth every 7 (seven) days. Take with a full glass of water on an empty stomach.      Marland Kitchen aspirin 325 MG EC tablet Take 325 mg by mouth daily.      . Calcium Carbonate-Vitamin D (CALCIUM 600/VITAMIN D) 600-400 MG-UNIT per tablet Take 1 tablet by mouth 2 (two) times daily.      . meloxicam (MOBIC) 15 MG tablet Take 15 mg by mouth as needed.       . metoprolol tartrate (LOPRESSOR) 25 MG tablet Take 25 mg by mouth 2 (two) times daily.      . nitroGLYCERIN (NITROSTAT) 0.4 MG SL tablet Place 0.4 mg under the tongue every 5 (five) minutes as needed.      . simvastatin (ZOCOR) 40 MG tablet Take 1 tablet (40 mg total) by mouth at bedtime.  90 tablet  3  . DISCONTD: triamcinolone cream (KENALOG) 0.1 % Apply 1 application topically as directed.        Allergies  Allergen Reactions  . Epinephrine     Past Medical History  Diagnosis Date  . 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 Dyslipidemia   . Osteoporosis     ROS: Negative except as per HPI  BP 128/72  Pulse 59  Ht 5\' 6"  (1.676 m)  Wt 60.238 kg (132 lb 12.8 oz)  BMI 21.43 kg/m2  PHYSICAL EXAM: Pt is alert and oriented, NAD HEENT: normal Neck: JVP - normal, carotids 2+= without bruits Lungs: CTA bilaterally CV: RRR without murmur or gallop Abd: soft, NT, Positive BS, no hepatomegaly Ext: no C/C/E, distal  pulses intact and equal Skin: warm/dry no rash  EKG:  Sinus rhythm with first degree AV block, 59 beats per minute, within normal limits.  ASSESSMENT AND PLAN:

## 2011-11-05 ENCOUNTER — Other Ambulatory Visit: Payer: Managed Care, Other (non HMO) | Admitting: *Deleted

## 2011-11-09 NOTE — Assessment & Plan Note (Addendum)
The patient is stable without anginal symptoms. Recommend followup in one year. I did advise her to reduce her aspirin dose to 81 mg daily.  She would like to discontinue metoprolol and I think this is reasonable considering her absence of angina. She will take this once daily for one week and then discontinue.

## 2011-11-09 NOTE — Assessment & Plan Note (Signed)
The patient's most recent lipids have been reviewed and they are at goal. She is due for a followup lipid studies.

## 2011-11-11 ENCOUNTER — Telehealth: Payer: Self-pay | Admitting: Cardiovascular Disease

## 2011-11-11 ENCOUNTER — Other Ambulatory Visit (INDEPENDENT_AMBULATORY_CARE_PROVIDER_SITE_OTHER): Payer: Medicare Other | Admitting: *Deleted

## 2011-11-11 ENCOUNTER — Other Ambulatory Visit: Payer: Self-pay | Admitting: *Deleted

## 2011-11-11 DIAGNOSIS — E7800 Pure hypercholesterolemia, unspecified: Secondary | ICD-10-CM

## 2011-11-11 DIAGNOSIS — I2510 Atherosclerotic heart disease of native coronary artery without angina pectoris: Secondary | ICD-10-CM

## 2011-11-11 LAB — HEPATIC FUNCTION PANEL
ALT: 19 U/L (ref 0–35)
AST: 27 U/L (ref 0–37)
Bilirubin, Direct: 0.1 mg/dL (ref 0.0–0.3)
Total Bilirubin: 0.7 mg/dL (ref 0.3–1.2)

## 2011-11-11 LAB — BASIC METABOLIC PANEL
BUN: 27 mg/dL — ABNORMAL HIGH (ref 6–23)
Calcium: 8.8 mg/dL (ref 8.4–10.5)
GFR: 64.08 mL/min (ref >60.00)
Glucose, Bld: 88 mg/dL (ref 70–99)
Potassium: 4.2 mEq/L (ref 3.5–5.1)

## 2011-11-11 MED ORDER — SIMVASTATIN 40 MG PO TABS: 40.0000 mg | ORAL_TABLET | Freq: Every day | ORAL | Status: DC

## 2011-11-11 NOTE — Telephone Encounter (Signed)
Walk In Pt Form " Pt Needs Refill" sent to Message Nurse 11/11/11/KM

## 2011-11-26 ENCOUNTER — Encounter: Payer: Self-pay | Admitting: Cardiovascular Disease

## 2011-11-26 NOTE — Telephone Encounter (Signed)
error 

## 2012-11-15 ENCOUNTER — Telehealth: Payer: Self-pay | Admitting: Cardiovascular Disease

## 2012-11-15 MED ORDER — SIMVASTATIN 40 MG PO TABS: 40.0000 mg | ORAL_TABLET | Freq: Every day | ORAL | Status: DC

## 2012-11-15 NOTE — Telephone Encounter (Signed)
Refill sent to [facility] to fill, pt aware.

## 2012-11-15 NOTE — Telephone Encounter (Signed)
New problem   Simvastatin  40 mg .    [facility] - [phone].

## 2012-11-23 ENCOUNTER — Ambulatory Visit (INDEPENDENT_AMBULATORY_CARE_PROVIDER_SITE_OTHER): Payer: Medicare Other | Admitting: Cardiovascular Disease

## 2012-11-23 ENCOUNTER — Encounter: Payer: Self-pay | Admitting: Cardiovascular Disease

## 2012-11-23 VITALS — BP 124/84 | HR 62 | Ht 66.0 in | Wt 127.0 lb

## 2012-11-23 DIAGNOSIS — I2510 Atherosclerotic heart disease of native coronary artery without angina pectoris: Secondary | ICD-10-CM

## 2012-11-23 DIAGNOSIS — E7800 Pure hypercholesterolemia, unspecified: Secondary | ICD-10-CM

## 2012-11-23 NOTE — Progress Notes (Signed)
   HPI:  72 year old woman presenting for followup evaluation. She was last seen one year ago. She has nonobstructive coronary artery disease. She excellent presented in 2008 with an acute coronary syndrome, but was not found to have significant CAD at cath. She has done well with continued medical treatment since that time. She continues to walk without symptoms. She has no chest pain, chest pressure, dyspnea, or palpitations.  Outpatient Encounter Prescriptions as of 11/23/2012  Medication Sig Dispense Refill  . aspirin 81 MG tablet Take 81 mg by mouth daily.      . Calcium Carbonate-Vitamin D (CALCIUM 600/VITAMIN D) 600-400 MG-UNIT per tablet Take 1 tablet by mouth 2 (two) times daily.      . nitroGLYCERIN (NITROSTAT) 0.4 MG SL tablet Place 0.4 mg under the tongue every 5 (five) minutes as needed.      . simvastatin (ZOCOR) 40 MG tablet Take 1 tablet (40 mg total) by mouth at bedtime.  90 tablet  3  . [DISCONTINUED] alendronate (FOSAMAX) 70 MG tablet Take 70 mg by mouth every 7 (seven) days. Take with a full glass of water on an empty stomach.      . [DISCONTINUED] aspirin 325 MG EC tablet Take 325 mg by mouth daily.      . [DISCONTINUED] meloxicam (MOBIC) 15 MG tablet Take 15 mg by mouth as needed.       . [DISCONTINUED] metoprolol tartrate (LOPRESSOR) 25 MG tablet Take 1 tablet (25 mg total) by mouth daily.       No facility-administered encounter medications on file as of 11/23/2012.    Allergies  Allergen Reactions  . Epinephrine     Past Medical History  Diagnosis Date  . 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 Dyslipidemia   . Osteoporosis    BP 124/84  Pulse 62  Ht 5\' 6"  (1.676 m)  Wt 57.607 kg (127 lb)  BMI 20.51 kg/m2  SpO2 99%  PHYSICAL EXAM: Pt is alert and oriented, NAD HEENT: normal Neck: JVP - normal, carotids 2+= without bruits Lungs: CTA bilaterally CV: RRR without murmur or gallop Abd: soft, NT, Positive BS, no hepatomegaly Ext: no C/C/E,  distal pulses intact and equal Skin: warm/dry no rash  EKG:  Sinus rhythm 62 beats per minute, nonspecific T wave abnormality.  ASSESSMENT AND PLAN: #1. Coronary artery disease, nonobstructive. She possibly had a vasospastic event but has done very well over the last several years. She will remain on aspirin for antiplatelet therapy and simvastatin for lipid-lowering.  #2. Hyperlipidemia. She will continue on simvastatin. Will repeat her lipids and LFTs before her office visit next year.  Tonny Bollman 11/23/2012 3:05 PM

## 2012-11-23 NOTE — Patient Instructions (Addendum)
Your physician wants you to follow-up in: 1 YEAR with Dr Excell Seltzer.  You will receive a reminder letter in the mail two months in advance. If you don't receive a letter, please call our office to schedule the follow-up appointment.  Your physician recommends that you return for a FASTING LIPID and LIVER in 1 YEAR--nothing to eat or drink after midnight.  Your physician recommends that you continue on your current medications as directed. Please refer to the Current Medication list given to you today.

## 2012-11-24 ENCOUNTER — Encounter: Payer: Self-pay | Admitting: Cardiovascular Disease

## 2013-05-04 ENCOUNTER — Other Ambulatory Visit: Payer: Self-pay

## 2013-08-04 ENCOUNTER — Other Ambulatory Visit: Payer: Self-pay

## 2013-11-10 ENCOUNTER — Other Ambulatory Visit: Payer: Self-pay

## 2013-11-10 MED ORDER — SIMVASTATIN 40 MG PO TABS: 40.0000 mg | ORAL_TABLET | Freq: Every day | ORAL | Status: DC

## 2013-12-28 ENCOUNTER — Other Ambulatory Visit: Payer: Medicare Other

## 2013-12-28 ENCOUNTER — Encounter: Payer: Self-pay | Admitting: Cardiovascular Disease

## 2013-12-28 ENCOUNTER — Other Ambulatory Visit: Payer: Self-pay | Admitting: Nurse Practitioner

## 2013-12-28 ENCOUNTER — Ambulatory Visit (INDEPENDENT_AMBULATORY_CARE_PROVIDER_SITE_OTHER): Payer: Medicare Other | Admitting: Cardiovascular Disease

## 2013-12-28 VITALS — BP 122/64 | HR 64 | Ht 66.0 in | Wt 126.0 lb

## 2013-12-28 DIAGNOSIS — E7800 Pure hypercholesterolemia, unspecified: Secondary | ICD-10-CM

## 2013-12-28 DIAGNOSIS — I252 Old myocardial infarction: Secondary | ICD-10-CM

## 2013-12-28 DIAGNOSIS — I2510 Atherosclerotic heart disease of native coronary artery without angina pectoris: Secondary | ICD-10-CM

## 2013-12-28 NOTE — Patient Instructions (Signed)
Your physician recommends that you continue on your current medications as directed. Please refer to the Current Medication list given to you today.  Your physician wants you to follow-up in: 1 year with Dr. Cooper.  You will receive a reminder letter in the mail two months in advance. If you don't receive a letter, please call our office to schedule the follow-up appointment.   

## 2013-12-28 NOTE — Progress Notes (Signed)
    HPI:  73 year old woman presenting for followup evaluation. She was last seen in February 2014. She's been followed for nonobstructive CAD. She initially presented in 2008 with ACS, but was found to have only nonobstructive CAD at that time. She has been treated medically without recurrent ischemic episodes.  Last lipid panel on file from February 2013: Lipid Panel     Component Value Date/Time   CHOL 151 11/11/2011 1029   TRIG 41.0 11/11/2011 1029   HDL 64.30 11/11/2011 1029   CHOLHDL 2 11/11/2011 1029   VLDL 8.2 11/11/2011 1029   LDLCALC 79 11/11/2011 1029   From a symptomatic perspective, she is doing fine. She walks regularly for exercise. She denies chest pain, chest pressure, dyspnea, edema, or palpitations. She has no cardiac related complaints today.  Outpatient Encounter Prescriptions as of 12/28/2013  Medication Sig  . aspirin 81 MG tablet Take 81 mg by mouth daily.  . Calcium Carbonate-Vitamin D (CALCIUM 600/VITAMIN D) 600-400 MG-UNIT per tablet Take 1 tablet by mouth 2 (two) times daily.  . nitroGLYCERIN (NITROSTAT) 0.4 MG SL tablet Place 0.4 mg under the tongue every 5 (five) minutes as needed.  . simvastatin (ZOCOR) 40 MG tablet Take 1 tablet (40 mg total) by mouth at bedtime.    Allergies  Allergen Reactions  . Epinephrine     Past Medical History  Diagnosis Date  . 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 Dyslipidemia   . Osteoporosis     ROS: Negative except as per HPI  BP 122/64  Pulse 64  Ht 5\' 6"  (1.676 m)  Wt 126 lb (57.153 kg)  BMI 20.35 kg/m2  PHYSICAL EXAM: Pt is alert and oriented, pleasant thin woman in NAD HEENT: normal Neck: JVP - normal, carotids 2+= without bruits Lungs: CTA bilaterally CV: RRR without murmur or gallop Abd: soft, NT, Positive BS, no hepatomegaly Ext: no C/C/E, distal pulses intact and equal Skin: warm/dry no rash  EKG:  Normal sinus rhythm 64 beats per minute, incomplete right bundle branch block,  nonspecific T wave abnormality. No significant change from previous tracing  ASSESSMENT AND PLAN: 1. Coronary atherosclerosis, native vessel. The patient remained stable without symptoms of angina. She will continue on low-dose aspirin and simvastatin. There is no antianginal therapy required. Her blood pressure remains under ideal control. She has had normal LV function. I will see her back in one year for followup evaluation.  2. Hyperlipidemia. The patient is due to have her lipids updated. Blood will be drawn today as she is fasting. She will continue on simvastatin unless her lipids are out of range.  Tonny BollmanMichael Divine Hansley 12/28/2013 11:39 AM

## 2013-12-28 NOTE — Addendum Note (Signed)
Addended by: Levi AlandSWINYER, Shwanda Soltis M on: 12/28/2013 11:59 AM   Modules accepted: Orders

## 2014-01-25 ENCOUNTER — Encounter (HOSPITAL_COMMUNITY): Payer: Self-pay | Admitting: Cardiovascular Disease

## 2014-01-31 ENCOUNTER — Other Ambulatory Visit (INDEPENDENT_AMBULATORY_CARE_PROVIDER_SITE_OTHER): Payer: Medicare Other

## 2014-01-31 DIAGNOSIS — E7800 Pure hypercholesterolemia, unspecified: Secondary | ICD-10-CM

## 2014-01-31 DIAGNOSIS — I2510 Atherosclerotic heart disease of native coronary artery without angina pectoris: Secondary | ICD-10-CM

## 2014-01-31 LAB — HEPATIC FUNCTION PANEL
ALBUMIN: 4.1 g/dL (ref 3.5–5.2)
ALK PHOS: 36 U/L — AB (ref 39–117)
ALT: 15 U/L (ref 0–35)
AST: 26 U/L (ref 0–37)
BILIRUBIN DIRECT: 0.1 mg/dL (ref 0.0–0.3)
BILIRUBIN TOTAL: 0.8 mg/dL (ref 0.2–1.2)
Total Protein: 6.7 g/dL (ref 6.0–8.3)

## 2014-01-31 LAB — LIPID PANEL
CHOL/HDL RATIO: 2
Cholesterol: 135 mg/dL (ref 0–200)
HDL: 59.3 mg/dL (ref >39.00)
LDL CALC: 69 mg/dL (ref 0–99)
TRIGLYCERIDES: 35 mg/dL (ref 0.0–149.0)
VLDL: 7 mg/dL (ref 0.0–40.0)

## 2014-02-13 ENCOUNTER — Other Ambulatory Visit (HOSPITAL_COMMUNITY): Payer: Self-pay | Admitting: Cardiovascular Disease

## 2014-02-14 MED ORDER — SIMVASTATIN 40 MG PO TABS: 40.0000 mg | ORAL_TABLET | Freq: Every day | ORAL | Status: DC

## 2015-01-10 ENCOUNTER — Ambulatory Visit (INDEPENDENT_AMBULATORY_CARE_PROVIDER_SITE_OTHER): Payer: Medicare Other | Admitting: Cardiovascular Disease

## 2015-01-10 ENCOUNTER — Encounter: Payer: Self-pay | Admitting: Cardiovascular Disease

## 2015-01-10 VITALS — BP 118/76 | HR 65 | Ht 66.0 in | Wt 130.8 lb

## 2015-01-10 DIAGNOSIS — I2510 Atherosclerotic heart disease of native coronary artery without angina pectoris: Secondary | ICD-10-CM

## 2015-01-10 DIAGNOSIS — E7800 Pure hypercholesterolemia, unspecified: Secondary | ICD-10-CM

## 2015-01-10 MED ORDER — SIMVASTATIN 40 MG PO TABS: 40.0000 mg | ORAL_TABLET | Freq: Every day | ORAL | Status: DC

## 2015-01-10 NOTE — Progress Notes (Signed)
Cardiology Office Note   Date:  01/10/2015   ID:  Andrea Frost, DOB Nov 06, 1940, MRN [id number]  PCP:  No primary care provider on file.  Cardiologist:  Tonny BollmanMichael Maezie Justin, MD    Chief Complaint  Patient presents with  . Coronary Artery Disease     History of Present Illness: Andrea Frost is a 74 y.o. female who presents for follow-up of nonobstructive CAD. She initially presented thousand 8 with acute coronary syndrome and was found to have nonobstructive coronary disease with recommendation for medical therapy. She has done well since that time.  The patient continues to walk regularly for exercise. She walks every day and denies any exertional symptoms. She specifically denies chest pain, chest pressure, shortness of breath, lightheadedness, or syncope. She admits to mild swelling mostly in her right lower leg. 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  Past Medical History  Diagnosis Date  . 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 Dyslipidemia   . Osteoporosis     History reviewed. No pertinent past surgical history.  Current Outpatient Prescriptions  Medication Sig Dispense Refill  . aspirin 81 MG tablet Take 81 mg by mouth daily.    . nitroGLYCERIN (NITROSTAT) 0.4 MG SL tablet Place 0.4 mg under the tongue every 5 (five) minutes as needed.    . simvastatin (ZOCOR) 40 MG tablet Take 1 tablet (40 mg total) by mouth at bedtime. 90 tablet 3  . VITAMIN D, CHOLECALCIFEROL, PO Take 5,000 Units by mouth daily.     No current facility-administered medications for this visit.    Allergies:   Epinephrine   Social History:  The patient  reports that she has never smoked. She does not have any smokeless tobacco history on file. She reports that she does not drink alcohol.   Family History:  The patient's  family history includes Heart attack in her brother.    ROS:  Please see the history of present illness. All other systems are reviewed and negative.      PHYSICAL EXAM: VS:  BP 118/76 mmHg  Pulse 65  Ht 5\' 6"  (1.676 m)  Wt 130 lb 12.8 oz (59.33 kg)  BMI 21.12 kg/m2 , BMI Body mass index is 21.12 kg/(m^2). GEN: Well nourished, well developed, in no acute distress HEENT: normal Neck: no JVD, no masses. No carotid bruits Cardiac: RRR without murmur or gallop                Respiratory:  clear to auscultation bilaterally, normal work of breathing GI: soft, nontender, nondistended, + BS MS: no deformity or atrophy Ext: no pretibial edema, pedal pulses 2+= bilaterally Skin: warm and dry, no rash Neuro:  Strength and sensation are intact Psych: euthymic mood, full affect  EKG:  EKG is ordered today. The ekg ordered today shows normal sinus rhythm 65 bpm, RV conduction delay, nonspecific ST abnormality. No change compared to tracing from 12/29/2013.  Recent Labs: 01/31/2014: ALT 15   Lipid Panel     Component Value Date/Time   CHOL 135 01/31/2014 1136   TRIG 35.0 01/31/2014 1136   HDL 59.30 01/31/2014 1136   CHOLHDL 2 01/31/2014 1136   VLDL 7.0 01/31/2014 1136   LDLCALC 69 01/31/2014 1136      Wt Readings from Last 3 Encounters:  01/10/15 130 lb 12.8 oz (59.33 kg)  12/28/13 126 lb (57.153 kg)  11/23/12 127 lb (57.607 kg)     ASSESSMENT AND PLAN: 1.  CAD,  nonobstructive: Her coronary disease has been quite stable since her initial presentation many years ago. She continues on aspirin. She is on a good exercise program. She will return next year for follow-up.  2. Hyperlipidemia: Lipids reviewed as above. She is at goal on simvastatin will repeat lipids next month.   Current medicines are reviewed with the patient today.  The patient does not have concerns regarding medicines.  The following changes have been made:  no change  Labs/ tests ordered today include:   Orders Placed This Encounter  Procedures  . Lipid panel  . Hepatic function panel  . EKG 12-Lead   Disposition:   FU one year  Signed, Tonny Bollman,  MD  01/10/2015 2:01 PM    [facility] [address] [place], [place], [place]  [address] Phone: [phone]; Fax: [phone]

## 2015-01-10 NOTE — Patient Instructions (Addendum)
Your physician wants you to follow-up in: 1 YEAR with Dr Excell Seltzerooper.  You will receive a reminder letter in the mail two months in advance. If you don't receive a letter, please call our office to schedule the follow-up appointment.  Your physician recommends that you continue on your current medications as directed. Please refer to the Current Medication list given to you today.  Your physician recommends that you return for a FASTING LIPID and LIVER in May--nothing to eat or drink after midnight, lab opens at 7:30 AM. Please call the office at [phone] to schedule lab appointment.   Please contact [facility] at [phone] to establish with Primary Care.

## 2015-04-17 ENCOUNTER — Other Ambulatory Visit (INDEPENDENT_AMBULATORY_CARE_PROVIDER_SITE_OTHER): Payer: Medicare Other

## 2015-04-17 DIAGNOSIS — I2510 Atherosclerotic heart disease of native coronary artery without angina pectoris: Secondary | ICD-10-CM | POA: Diagnosis not present

## 2015-04-17 DIAGNOSIS — E7800 Pure hypercholesterolemia, unspecified: Secondary | ICD-10-CM

## 2015-04-17 LAB — LIPID PANEL
CHOLESTEROL: 134 mg/dL (ref 0–200)
HDL: 61.7 mg/dL (ref >39.00)
LDL CALC: 64 mg/dL (ref 0–99)
NONHDL: 72.3
TRIGLYCERIDES: 44 mg/dL (ref 0.0–149.0)
Total CHOL/HDL Ratio: 2
VLDL: 8.8 mg/dL (ref 0.0–40.0)

## 2015-04-17 LAB — HEPATIC FUNCTION PANEL
ALT: 9 U/L (ref 0–35)
AST: 19 U/L (ref 0–37)
Albumin: 3.9 g/dL (ref 3.5–5.2)
Alkaline Phosphatase: 42 U/L (ref 39–117)
BILIRUBIN DIRECT: 0.1 mg/dL (ref 0.0–0.3)
Total Bilirubin: 0.6 mg/dL (ref 0.2–1.2)
Total Protein: 6.4 g/dL (ref 6.0–8.3)

## 2016-02-12 ENCOUNTER — Other Ambulatory Visit: Payer: Self-pay | Admitting: *Deleted

## 2016-02-12 DIAGNOSIS — I2510 Atherosclerotic heart disease of native coronary artery without angina pectoris: Secondary | ICD-10-CM

## 2016-02-12 DIAGNOSIS — E7800 Pure hypercholesterolemia, unspecified: Secondary | ICD-10-CM

## 2016-02-12 MED ORDER — SIMVASTATIN 40 MG PO TABS: 40.0000 mg | ORAL_TABLET | Freq: Every day | ORAL | Status: DC

## 2016-03-20 ENCOUNTER — Encounter: Payer: Self-pay | Admitting: *Deleted

## 2016-04-03 ENCOUNTER — Ambulatory Visit (INDEPENDENT_AMBULATORY_CARE_PROVIDER_SITE_OTHER): Payer: Medicare Other | Admitting: Cardiovascular Disease

## 2016-04-03 ENCOUNTER — Encounter: Payer: Self-pay | Admitting: Cardiovascular Disease

## 2016-04-03 VITALS — BP 120/68 | HR 60 | Ht 66.0 in | Wt 125.1 lb

## 2016-04-03 DIAGNOSIS — I2510 Atherosclerotic heart disease of native coronary artery without angina pectoris: Secondary | ICD-10-CM

## 2016-04-03 DIAGNOSIS — I252 Old myocardial infarction: Secondary | ICD-10-CM | POA: Diagnosis not present

## 2016-04-03 DIAGNOSIS — E785 Hyperlipidemia, unspecified: Secondary | ICD-10-CM

## 2016-04-03 NOTE — Progress Notes (Signed)
Cardiology Office Note Date:  04/05/2016   ID:  Andrea Frost, DOB 1941-06-26, MRN [id number]  PCP:  No primary care provider on file.  Cardiologist:  Tonny Bollmanooper, Suellyn Meenan, MD    Chief Complaint  Patient presents with  . Coronary Artery Disease     History of Present Illness: Andrea Frost is a 75 y.o. female who presents for follow-up of nonobstructive CAD. She initially presented in 2008 with acute coronary syndrome and was found to have nonobstructive coronary disease with recommendation for medical therapy. She has done well since that time.  The patient is doing very well. Today, she denies symptoms of palpitations, chest pain, shortness of breath, orthopnea, PND, lower extremity edema, dizziness, or syncope. She continues to walk 30 minutes daily, including up and down hills, with no exertional symptoms.  Past Medical History  Diagnosis Date  . 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 Dyslipidemia   . Osteoporosis     Past Surgical History  Procedure Laterality Date  . Left heart cath  04/2007    Current Outpatient Prescriptions  Medication Sig Dispense Refill  . aspirin 81 MG tablet Take 81 mg by mouth daily.    . nitroGLYCERIN (NITROSTAT) 0.4 MG SL tablet Place 0.4 mg under the tongue every 5 (five) minutes as needed for chest pain.     Marland Kitchen. PREMARIN vaginal cream Place 0.5 applicators vaginally 3 (three) times a week.  2  . simvastatin (ZOCOR) 40 MG tablet Take 1 tablet (40 mg total) by mouth at bedtime. 90 tablet 0  . VITAMIN D, CHOLECALCIFEROL, PO Take 1,000 Units by mouth daily.      No current facility-administered medications for this visit.    Allergies:   Epinephrine   Social History:  The patient  reports that she has never smoked. She does not have any smokeless tobacco history on file. She reports that she does not drink alcohol.   Family History:  The patient's  family history includes Heart attack in her brother.    ROS:  Please  see the history of present illness.  Otherwise, review of systems is positive for right foot pain/numbness.  All other systems are reviewed and negative.    PHYSICAL EXAM: VS:  BP 120/68 mmHg  Pulse 60  Ht 5\' 6"  (1.676 m)  Wt 125 lb 1.9 oz (56.754 kg)  BMI 20.20 kg/m2 , BMI Body mass index is 20.2 kg/(m^2). GEN: Well nourished, well developed, pleasant thin woman in no acute distress HEENT: normal Neck: no JVD, no masses. No carotid bruits Cardiac: RRR without murmur or gallop                Respiratory:  clear to auscultation bilaterally, normal work of breathing GI: soft, nontender, nondistended, + BS MS: no deformity or atrophy Ext: no pretibial edema, pedal pulses 2+= bilaterally Skin: warm and dry, no rash Neuro:  Strength and sensation are intact Psych: euthymic mood, full affect  EKG:  EKG is ordered today. The ekg ordered today shows NSR 60 bpm,   Recent Labs: 04/17/2015: ALT 9   Lipid Panel     Component Value Date/Time   CHOL 134 04/17/2015 1007   TRIG 44.0 04/17/2015 1007   HDL 61.70 04/17/2015 1007   CHOLHDL 2 04/17/2015 1007   VLDL 8.8 04/17/2015 1007   LDLCALC 64 04/17/2015 1007      Wt Readings from Last 3 Encounters:  04/03/16 125 lb 1.9 oz (56.754 kg)  01/10/15 130 lb 12.8 oz (59.33 kg)  12/28/13 126 lb (57.153 kg)    ASSESSMENT AND PLAN: 1.  CAD, nonobstructive: Her coronary disease has been quite stable since her initial presentation many years ago. She continues on aspirin and a statin drug.   2. Hyperlipidemia: Lipids reviewed from one year ago. She is at goal on simvastatin will repeat lipids and LFT's.   Current medicines are reviewed with the patient today.  The patient does not have concerns regarding medicines.  Labs/ tests ordered today include:   Orders Placed This Encounter  Procedures  . Lipid panel  . Hepatic function panel  . EKG 12-Lead    Disposition:   FU one year  Signed, Tonny Bollmanooper, Kennedi Lizardo, MD  04/05/2016 5:40 AM    [facility] [address], [place], [place]  [id number] Phone: [phone]; Fax: [phone]

## 2016-04-03 NOTE — Patient Instructions (Signed)
Medication Instructions:  Your physician recommends that you continue on your current medications as directed. Please refer to the Current Medication list given to you today.  Labwork: Your physician recommends that you return for a FASTING LIPID and LIVER--nothing to eat or drink after midnight, lab opens at 7:30 AM  Testing/Procedures: No new orders.   Follow-Up: Your physician wants you to follow-up in: 1 YEAR with Dr Cooper.  You will receive a reminder letter in the mail two months in advance. If you don't receive a letter, please call our office to schedule the follow-up appointment.   Any Other Special Instructions Will Be Listed Below (If Applicable).     If you need a refill on your cardiac medications before your next appointment, please call your pharmacy.   

## 2016-04-10 ENCOUNTER — Other Ambulatory Visit (INDEPENDENT_AMBULATORY_CARE_PROVIDER_SITE_OTHER): Payer: Medicare Other | Admitting: *Deleted

## 2016-04-10 DIAGNOSIS — I252 Old myocardial infarction: Secondary | ICD-10-CM

## 2016-04-10 DIAGNOSIS — E785 Hyperlipidemia, unspecified: Secondary | ICD-10-CM

## 2016-04-10 DIAGNOSIS — I2510 Atherosclerotic heart disease of native coronary artery without angina pectoris: Secondary | ICD-10-CM | POA: Diagnosis not present

## 2016-04-10 LAB — HEPATIC FUNCTION PANEL
ALK PHOS: 45 U/L (ref 33–130)
ALT: 11 U/L (ref 6–29)
AST: 22 U/L (ref 10–35)
Albumin: 4 g/dL (ref 3.6–5.1)
BILIRUBIN TOTAL: 0.7 mg/dL (ref 0.2–1.2)
Bilirubin, Direct: 0.2 mg/dL (ref <=0.2)
Indirect Bilirubin: 0.5 mg/dL (ref 0.2–1.2)
Total Protein: 6.1 g/dL (ref 6.1–8.1)

## 2016-04-10 LAB — LIPID PANEL
Cholesterol: 126 mg/dL (ref 125–200)
HDL: 67 mg/dL (ref >=46)
LDL Cholesterol: 51 mg/dL (ref <130)
TRIGLYCERIDES: 42 mg/dL (ref <150)
Total CHOL/HDL Ratio: 1.9 Ratio (ref <=5.0)
VLDL: 8 mg/dL (ref <30)

## 2016-04-17 ENCOUNTER — Telehealth: Payer: Self-pay | Admitting: Cardiovascular Disease

## 2016-04-17 NOTE — Telephone Encounter (Signed)
Follow Up:   Returning call from today,she said she did not know who called.

## 2016-04-17 NOTE — Telephone Encounter (Signed)
Called patient back with her lab results °

## 2016-05-12 ENCOUNTER — Other Ambulatory Visit: Payer: Self-pay | Admitting: *Deleted

## 2016-05-12 DIAGNOSIS — E7800 Pure hypercholesterolemia, unspecified: Secondary | ICD-10-CM

## 2016-05-12 DIAGNOSIS — I2510 Atherosclerotic heart disease of native coronary artery without angina pectoris: Secondary | ICD-10-CM

## 2016-05-12 MED ORDER — SIMVASTATIN 40 MG PO TABS: 40.0000 mg | ORAL_TABLET | Freq: Every day | ORAL | 3 refills | Status: DC

## 2017-05-06 ENCOUNTER — Encounter: Payer: Self-pay | Admitting: Cardiovascular Disease

## 2017-05-06 ENCOUNTER — Ambulatory Visit (INDEPENDENT_AMBULATORY_CARE_PROVIDER_SITE_OTHER): Payer: Medicare Other | Admitting: Cardiovascular Disease

## 2017-05-06 DIAGNOSIS — E7800 Pure hypercholesterolemia, unspecified: Secondary | ICD-10-CM | POA: Diagnosis not present

## 2017-05-06 DIAGNOSIS — I2510 Atherosclerotic heart disease of native coronary artery without angina pectoris: Secondary | ICD-10-CM

## 2017-05-06 MED ORDER — SIMVASTATIN 40 MG PO TABS: 40.0000 mg | ORAL_TABLET | Freq: Every day | ORAL | 3 refills | Status: AC

## 2017-05-06 NOTE — Progress Notes (Signed)
Cardiology Office Note Date:  05/06/2017   ID:  Andrea Frost, DOB 05-Dec-1940, MRN [id number]  PCP:  No primary care provider on file.  Cardiologist:  Sherren Mocha, MD    Chief Complaint  Patient presents with  . Follow-up     History of Present Illness: Andrea Frost is a 76 y.o. female who presents for follow-up of nonobstructive CAD. She initially presented in 2008 with acute coronary syndrome and was found to have nonobstructive coronary disease with recommendation for medical therapy. She has done well since that time.  The patient is here alone today. She is doing very well. She continues to walk for exercise on a regular basis with no exertional symptoms. Today, she denies symptoms of palpitations, chest pain, shortness of breath, orthopnea, PND, lower extremity edema, dizziness, or syncope.   Past Medical History:  Diagnosis Date  . CAD (coronary artery disease) 2008   nonobstructive  . Dyslipidemia   . Osteoporosis     Past Surgical History:  Procedure Laterality Date  . LEFT HEART CATH  04/2007    Current Outpatient Prescriptions  Medication Sig Dispense Refill  . aspirin 81 MG tablet Take 81 mg by mouth daily.    Marland Kitchen PREMARIN vaginal cream Place 0.5 applicators vaginally 3 (three) times a week.  2  . simvastatin (ZOCOR) 40 MG tablet Take 1 tablet (40 mg total) by mouth at bedtime. 90 tablet 3  . VITAMIN D, CHOLECALCIFEROL, PO Take 1,000 Units by mouth daily.      No current facility-administered medications for this visit.     Allergies:   Epinephrine   Social History:  The patient  reports that she has never smoked. She has never used smokeless tobacco. She reports that she does not drink alcohol.   Family History:  The patient's family history includes Heart attack in her brother.    ROS:  Please see the history of present illness.  All other systems are reviewed and negative.    PHYSICAL EXAM: VS:  BP 124/68   Pulse 68   Ht 5' 6"  (1.676  m)   Wt 127 lb 3.2 oz (57.7 kg)   BMI 20.53 kg/m  , BMI Body mass index is 20.53 kg/m. GEN: Well nourished, well developed, in no acute distress  HEENT: normal  Neck: no JVD, no masses. No carotid bruits Cardiac: RRR without murmur or gallop                Respiratory:  clear to auscultation bilaterally, normal work of breathing GI: soft, nontender, nondistended, + BS MS: no deformity or atrophy  Ext: no pretibial edema, pedal pulses 2+= bilaterally Skin: warm and dry, no rash Neuro:  Strength and sensation are intact Psych: euthymic mood, full affect  EKG:  EKG is ordered today. The ekg ordered today shows normal sinus rhythm 68 bpm, nonspecific ST abnormality.  Recent Labs: No results found for requested labs within last 8760 hours.   Lipid Panel     Component Value Date/Time   CHOL 126 04/10/2016 1105   TRIG 42 04/10/2016 1105   HDL 67 04/10/2016 1105   CHOLHDL 1.9 04/10/2016 1105   VLDL 8 04/10/2016 1105   LDLCALC 51 04/10/2016 1105      Wt Readings from Last 3 Encounters:  05/06/17 127 lb 3.2 oz (57.7 kg)  04/03/16 125 lb 1.9 oz (56.8 kg)  01/10/15 130 lb 12.8 oz (59.3 kg)     ASSESSMENT AND PLAN: 1.  Nonobstructive  coronary artery disease: Patient without angina. Medications are reviewed and will be continued without change.  2. Hyperlipidemia: The patient is due for updated labs. We'll schedule lipids, LFTs, and a metabolic panel. She is treated with simvastatin. Last year's labs are reviewed today.  Current medicines are reviewed with the patient today.  The patient does not have concerns regarding medicines.  Labs/ tests ordered today include:   Orders Placed This Encounter  Procedures  . Comp Met (CMET)  . Lipid Profile  . EKG 12-Lead    Disposition:   FU one year  Signed, Sherren Mocha, MD  05/06/2017 1:27 PM    [facility] [place], [place], [place]  [address] Phone: [phone]; Fax: [phone]

## 2017-05-06 NOTE — Patient Instructions (Signed)
Medication Instructions:  Your physician recommends that you continue on your current medications as directed. Please refer to the Current Medication list given to you today.   Labwork: Your physician recommends that you return for fasting lab work on Tuesday August 14 anytime between 7:30 am and 4:30 pm   Testing/Procedures: None Ordered   Follow-Up: Your physician wants you to follow-up in: 1 year with Dr. Excell Seltzerooper.  You will receive a reminder letter in the mail two months in advance. If you don't receive a letter, please call our office to schedule the follow-up appointment.   If you need a refill on your cardiac medications before your next appointment, please call your pharmacy.   Thank you for choosing [facility]! Eligha BridegroomMichelle Swinyer, RN [phone]

## 2017-05-12 ENCOUNTER — Other Ambulatory Visit: Payer: Medicare Other

## 2017-05-21 ENCOUNTER — Other Ambulatory Visit: Payer: Medicare Other | Admitting: *Deleted

## 2017-05-21 DIAGNOSIS — I2510 Atherosclerotic heart disease of native coronary artery without angina pectoris: Secondary | ICD-10-CM

## 2017-05-21 DIAGNOSIS — E7800 Pure hypercholesterolemia, unspecified: Secondary | ICD-10-CM

## 2017-05-21 LAB — COMPREHENSIVE METABOLIC PANEL
A/G RATIO: 1.8 (ref 1.2–2.2)
ALK PHOS: 50 IU/L (ref 39–117)
ALT: 14 IU/L (ref 0–32)
AST: 26 IU/L (ref 0–40)
Albumin: 4.3 g/dL (ref 3.5–4.8)
BILIRUBIN TOTAL: 0.7 mg/dL (ref 0.0–1.2)
BUN / CREAT RATIO: 18 (ref 12–28)
BUN: 16 mg/dL (ref 8–27)
CHLORIDE: 104 mmol/L (ref 96–106)
CO2: 22 mmol/L (ref 20–29)
Calcium: 9 mg/dL (ref 8.7–10.3)
Creatinine, Ser: 0.87 mg/dL (ref 0.57–1.00)
GFR calc non Af Amer: 65 mL/min/{1.73_m2} (ref >59)
GFR, EST AFRICAN AMERICAN: 75 mL/min/{1.73_m2} (ref >59)
GLUCOSE: 87 mg/dL (ref 65–99)
Globulin, Total: 2.4 g/dL (ref 1.5–4.5)
POTASSIUM: 4 mmol/L (ref 3.5–5.2)
Sodium: 142 mmol/L (ref 134–144)
TOTAL PROTEIN: 6.7 g/dL (ref 6.0–8.5)

## 2017-05-21 LAB — LIPID PANEL
CHOLESTEROL TOTAL: 132 mg/dL (ref 100–199)
Chol/HDL Ratio: 2.1 ratio (ref 0.0–4.4)
HDL: 63 mg/dL (ref >39)
LDL Calculated: 60 mg/dL (ref 0–99)
Triglycerides: 45 mg/dL (ref 0–149)
VLDL Cholesterol Cal: 9 mg/dL (ref 5–40)

## 2017-08-25 ENCOUNTER — Encounter (INDEPENDENT_AMBULATORY_CARE_PROVIDER_SITE_OTHER): Payer: Self-pay | Admitting: Family Medicine

## 2017-08-25 ENCOUNTER — Ambulatory Visit (INDEPENDENT_AMBULATORY_CARE_PROVIDER_SITE_OTHER): Payer: Medicare Other | Admitting: Family Medicine

## 2017-08-25 VITALS — BP 140/80 | HR 73 | Temp 97.4°F | Ht 66.0 in | Wt 130.0 lb

## 2017-08-25 DIAGNOSIS — Z6820 Body mass index (BMI) 20.0-20.9, adult: Secondary | ICD-10-CM

## 2017-08-25 DIAGNOSIS — E785 Hyperlipidemia, unspecified: Principal | ICD-10-CM

## 2017-08-25 DIAGNOSIS — N952 Postmenopausal atrophic vaginitis: Secondary | ICD-10-CM

## 2017-08-25 DIAGNOSIS — Z23 Encounter for immunization: Secondary | ICD-10-CM

## 2017-08-25 NOTE — Patient Instructions (Signed)
Call office for any medical concerns

## 2017-08-25 NOTE — Progress Notes (Addendum)
Subjective:     Patient ID:  Courtney [name] is an 76 y.o. female     Chief Complaint:    Chief Complaint   Patient presents with   . Establish Care     [place]    . Other     pt declined flu vacc       HPI  The patient is a pleasant 76 yr old female here to establish care. [place] from [place] husbands's death. She lives with her sister-in-law now. She has a hx of MI  (2008) and has known hyperlipidemia. She only noted that her knees and legs are feel weak initially when standing after sitting for a long time. After she has settled herself, she feels more stable. No recent falls. No dizziness or light-headedness. Otherwise, she said she is doing well.   Denied chest pain shortness of breath palpitations. Has good appetite and normal bowel movements   She uses premarin cream for vaginal atrophy and she said its helping a lot. She does no smoke .   She said she has fasting bloodwork done in the last 6 mos done at her cardiologist's clinic    Review of Systems   Constitutional: Negative for activity change, appetite change, fatigue and unexpected weight change.   Respiratory: Negative for cough.    Gastrointestinal: Negative for abdominal pain, constipation, diarrhea, nausea and vomiting.   Musculoskeletal: Negative for arthralgias, back pain, gait problem, joint swelling, myalgias, neck pain and neck stiffness.   Neurological: Negative for tremors and headaches.         Allergy History as of 08/25/17     EPINEPHRINE       Noted Status Severity Type Reaction    08/25/17 1313 Venda RodesCross, Jessie R, [place] 08/25/17 Active       Comments:  Nervous/jittery               Current Outpatient Prescriptions   Medication Sig   . aspirin (ECOTRIN) 81 mg Oral Tablet, Delayed Release (E.C.) Take 81 mg by mouth Once a day   . cholecalciferol, vitamin D3, (VITAMIN D) 1,000 unit Oral Tablet Take 1,000 Units by mouth Once a day   . conjugated estrogens (PREMARIN) 0.625 mg/gram Vaginal  Cream 0.5 g by Vaginal route Per instructions 3 times a week   . simvastatin (ZOCOR) 40 mg Oral Tablet Take 40 mg by mouth Every evening     Active Ambulatory Problems     Diagnosis Date Noted   . No Active Ambulatory Problems     Resolved Ambulatory Problems     Diagnosis Date Noted   . No Resolved Ambulatory Problems     Past Medical History:   Diagnosis Date   . Past heart attack      Family Medical History:     Problem Relation (Age of Onset)    Alzheimer's/Dementia Father    Heart Attack Brother, Paternal Grandfather    No Known Problems Sister, Maternal Grandmother, Maternal Grandfather    Ovarian Cancer Mother        Past Surgical History:   Procedure Laterality Date   . HX CATARACT REMOVAL     . TRIGGER FINGER RELEASE           Objective:   BP 140/80  Pulse 73  Temp 36.3 C (97.4 F)  Ht 1.676 m (5\' 6" )  Wt 59 kg (130 lb)  SpO2 96%  BMI 20.98 kg/m2  Physical Exam   Constitutional: She is oriented to person, place, and time. She appears well-developed and well-nourished. No distress.   HENT:   Head: Normocephalic and atraumatic.   Right Ear: External ear normal.   Left Ear: External ear normal.   Nose: Nose normal.   Mouth/Throat: Oropharynx is clear and moist. No oropharyngeal exudate.   Eyes: Pupils are equal, round, and reactive to light. Conjunctivae and EOM are normal. Right eye exhibits no discharge. Left eye exhibits no discharge. No scleral icterus.   Neck: Normal range of motion. Neck supple. No tracheal deviation present. No thyromegaly present.   Cardiovascular: Normal rate, regular rhythm, normal heart sounds and intact distal pulses.    No murmur heard.  Pulmonary/Chest: Effort normal and breath sounds normal. No respiratory distress. She has no wheezes. She exhibits no tenderness.   Abdominal: Soft. Bowel sounds are normal. She exhibits no distension and no mass.   Musculoskeletal: Normal range of motion. She exhibits no edema, tenderness or deformity.   Lymphadenopathy:     She has no  cervical adenopathy.   Neurological: She is alert and oriented to person, place, and time. She has normal reflexes. She displays normal reflexes. No cranial nerve deficit. She exhibits normal muscle tone. Coordination normal.   Skin: Skin is warm. No rash noted. She is not diaphoretic. No erythema. No pallor.   Psychiatric: She has a normal mood and affect. Her behavior is normal. Judgment and thought content normal.   Nursing note and vitals reviewed.  normal gait  Sensory and Motor strength of upper and lowe extremity: 5/5  Ortho Exam    Assessment & Plan:       ICD-10-CM    1. Hyperlipidemia, unspecified hyperlipidemia type E78.5    2. 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 Need for influenza vaccination Z23 Influenza Vaccine IM Age 40mo through Adult 0.5ML(admin)   4. BMI 20.0-20.9, adult Z68.20    will obtain patient's old med records from her previous medical providers  Continue meds  Advised to stay active to improve and maintain muscle strength  Medication safety and compliance discussed  She verbalized agreement and understanding    Follow up in 4 weeks for annual pe or sooner if with medical concerns

## 2017-09-24 ENCOUNTER — Encounter (INDEPENDENT_AMBULATORY_CARE_PROVIDER_SITE_OTHER): Payer: Self-pay | Admitting: Family Medicine

## 2017-09-24 ENCOUNTER — Ambulatory Visit (INDEPENDENT_AMBULATORY_CARE_PROVIDER_SITE_OTHER): Payer: Medicare Other | Admitting: Family Medicine

## 2017-09-24 VITALS — BP 130/70 | HR 66 | Temp 97.4°F | Resp 12 | Ht 66.0 in | Wt 131.0 lb

## 2017-09-24 DIAGNOSIS — Z6821 Body mass index (BMI) 21.0-21.9, adult: Secondary | ICD-10-CM

## 2017-09-24 DIAGNOSIS — Z0000 Encounter for general adult medical examination without abnormal findings: Principal | ICD-10-CM

## 2017-09-24 DIAGNOSIS — Z23 Encounter for immunization: Secondary | ICD-10-CM

## 2017-09-24 NOTE — Progress Notes (Signed)
[facility], [facility]  [address]  [address]  [place] [address]    Medicare Annual Wellness Visit    Name: Courtney [name] MRN:  [id number]   Date: 09/24/2017 Age: 76 y.o.       SUBJECTIVE:   Courtney [name] is a 76 y.o. female for presenting for Medicare Wellness exam.   I have reviewed and reconciled the medication list with the patient today.    I have reviewed and updated as appropriate the past medical, family and social history. 09/24/2017 as summarized below:  Past Medical History:   Diagnosis Date   . Past heart attack     take simvastatin      Past Surgical History:   Procedure Laterality Date   . Hx cataract removal     . Trigger finger release       Current Outpatient Medications   Medication Sig   . aspirin (ECOTRIN) 81 mg Oral Tablet, Delayed Release (E.C.) Take 81 mg by mouth Once a day   . cholecalciferol, vitamin D3, (VITAMIN D) 1,000 unit Oral Tablet Take 1,000 Units by mouth Once a day   . conjugated estrogens (PREMARIN) 0.625 mg/gram Vaginal Cream 0.5 g by Vaginal route Per instructions 3 times a week   . simvastatin (ZOCOR) 40 mg Oral Tablet Take 40 mg by mouth Every evening     Family Medical History:     Problem Relation (Age of Onset)    Alzheimer's/Dementia Father    Heart Attack Brother, Paternal Grandfather    No Known Problems Sister, Maternal Grandmother, Maternal Grandfather    Ovarian Cancer Mother            Social History     Socioeconomic History   . Marital status: Widowed     Spouse name: Not on file   . Number of children: Not on file   . Years of education: Not on file   . Highest education level: Not on file   Social Needs   . Financial resource strain: Not on file   . Food insecurity - worry: Not on file   . Food insecurity - inability: Not on file   . Transportation needs - medical: Not on file   . Transportation needs - non-medical: Not on file   Occupational History   . Not on file   Tobacco Use   . Smoking status: Never Smoker   . Smokeless tobacco:  Never Used   Substance and Sexual Activity   . Alcohol use: No   . Drug use: Not on file   . Sexual activity: Not on file   Other Topics Concern   . Not on file   Social History Narrative   . Not on file     Review of Systems: Constitutional: negative  Eyes: negative  HEENT: negative  Respiratory: negative  Cardiology: negative  GI: negative  GU: negative  Skin Breast: negative  Hematologic: negative  Musculoskeletal:negative  Neuro: negative  Psychiatric: negative  Endocrine: negative  Immunologic: negative     List of Current Health Care Providers   Care Team     PCP     Name Type Specialty Phone Number    Ernest Haber, MD Physician [facility] [phone]          Care Team     No care team found                  Health Maintenance   Topic  Date Due   . Adult Tdap-Td (1 - Tdap) 07/10/1960   . Osteoporosis screening  07/10/2006   . Shingles Vaccine (2 of 2) 08/17/2017   . Pneumococcal 65+ Years Low Risk (2 of 2 - PPSV23) 09/24/2018   . Depression Screening  09/24/2018   . Annual Wellness Exam  09/24/2018   . Influenza Vaccine  Completed     Medicare Wellness Assessment   Medicare initial or wellness physical in the last year?: Yes  Advance Directives (optional)   Does patient have a living will or MPOA: YES   Has patient provided [facility] with a copy?: YES   Advance directive information given to the patient today?: no      Activities of Daily Living   Do you need help with dressing, bathing, or walking?: No   Do you need help with shopping, housekeeping, medications, or finances?: No   Do you have rugs in hallways, broken steps, or poor lighting?: Yes   Do you have grab bars in your bathroom, non-slip strips in your tub, and hand rails on your stairs?: Yes   Urinary Incontinence Screen (Women >=65 only)   Do you ever leak urine when you don't want to?: YES   Cognitive Function Screen (1=Yes, 0=No)   What is you age?: Correct   What is the time to the nearest hour?: Correct   What is the year?: Correct    What is the name of this clinic?: Correct   Can the patient recognize two persons (the doctor, the nurse, home help, etc.)?: Correct   What is the date of your birth? (day and month sufficient) : Correct   In what year did World War II end?: Correct   Who is the current president of the [place]?: Correct   Count from 20 down to 1?: Correct   What address did I give you earlier?: Correct   Total Score: 10   Hearing Screen   Have you noticed any hearing difficulties?: Yes  After whispering 9-1-6 how many numbers did the patient repeat correctly?: 2  After whispering 4-7-8 how many numbers did the patient repeat correctly?: 3   Fall Risk Screen   Do you feel unsteady when standing or walking?: Yes  Do you worry about falling?: Yes  Have you fallen in the past year?: No      Vision Screen   Right Eye = 20: 15   Left Eye = 20: 30   Depression Screen   Little interest or pleasure in doing things.: Not at all  Feeling down, depressed, or hopeless: Not at all  PHQ 2 Total: 0        OBJECTIVE:   BP 130/70   Pulse 66   Temp 36.3 C (97.4 F)   Resp 12   Ht 1.676 m (5\' 6" )   Wt 59.4 kg (131 lb)   SpO2 97%   BMI 21.14 kg/m        Other appropriate exam:  Constitutional: She is oriented to person, place, and time. She appears well-developed and well-nourished. No distress.   HENT:   Head: Normocephalic and atraumatic.   Right Ear: External ear normal.   Left Ear: External ear normal.   Nose: Nose normal.   Mouth/Throat: Oropharynx is clear and moist. No oropharyngeal exudate.   Eyes: Pupils are equal, round, and reactive to light. Conjunctivae and EOM are normal. Right eye exhibits no discharge. Left eye exhibits no discharge. No scleral icterus.  Neck: Normal range of motion. Neck supple. No tracheal deviation present. No thyromegaly present.   Cardiovascular: Normal rate, regular rhythm, normal heart sounds and intact distal pulses.    No murmur heard.  Pulmonary/Chest: Effort normal and breath sounds normal. No  respiratory distress. She has no wheezes. She exhibits no tenderness.   Abdominal: Soft. Bowel sounds are normal. She exhibits no distension and no mass.   Musculoskeletal: Normal range of motion. She exhibits no edema, tenderness or deformity.   Lymphadenopathy:     She has no cervical adenopathy.   Neurological: She is alert and oriented to person, place, and time. She has normal reflexes. She displays normal reflexes. No cranial nerve deficit. She exhibits normal muscle tone. Coordination normal.   Skin: Skin is warm. No rash noted. She is not diaphoretic. No erythema. No pallor.   Psychiatric: She has a normal mood and affect. Her behavior is normal. Judgment and thought content normal.   Nursing note and vitals reviewed.  normal gait  Sensory and Motor strength of upper and lowe extremity: 5/5  Ortho Exam      Health Maintenance Due   Topic Date Due   . Adult Tdap-Td (1 - Tdap) 07/10/1960   . Osteoporosis screening  07/10/2006   . Shingles Vaccine (2 of 2) 08/17/2017      ASSESSMENT & PLAN:    Identified Risk Factors/ Recommended Actions   BMI addressed: Advised on diet, weight loss, and exercise to reduce above normal BMI.    Will refer to [facility] regarding balance issues         Orders Placed This Encounter   . Prevnar 13          The patient has been educated about risk factors and recommended preventive care. Written Prevention Plan completed/ updated and given to patient (see After Visit Summary).    Return in about 3 months (around 12/23/2017), or if symptoms worsen or fail to improve, for for follow up .    Ernest HaberLea Dylan Monforte, MD  [facility]  [facility]  [address]  [place] [id number]  [phone]

## 2017-09-24 NOTE — Patient Instructions (Addendum)
Medicare Preventive Services  Medicare coverage information Recommendation for YOU   Heart Disease and Diabetes   Lipid profile every 5 years or more often if at risk for cardiovascular disease     Diabetes Screening  yearly for those at risk for diabetes, 2 tests per year for those with prediabetes Last Glucose:     Diabetes Self Management Training or Medical Nutrition Therapy  for those with diabetes, up to 10 hrs initial training within a year, subsequent years up to 2 hrs of follow up training Optional for those with diabetes     Medical Nutrition Therapy three hours of one-on-one counseling in first year, two hours in subsequent years Optional for those with diabetes, kidney disease   Intensive Behavioral Therapy for Obesity  Face-to-face counseling, first month every week, month 2-6 every other week, month 7-12 every month if continued progress is documented Optional for those with Body Mass Index 30 or higher  Your Body mass index is 21.14 kg/m.   Tobacco Cessation (Quitting) Counseling   two attempts per year, max 4 sessions per attempt, up to 8 per year, for those with tobacco-related health condition Optional for those that use tobacco   Cancer Screening   Colorectal screening   for anyone age 450 to 6275 or any age if high risk:  . Screening Colonoscopy every 10 yrs if low risk,  more frequent if higher risk  OR  . Flexible  Sigmoidoscopy  every 5 yr OR  . Fecal Occult Blood Testing yearly OR  . Cologuard Stool DNA test once every 3 years OR  . CT Colonography every 5 yrs    See your schedule below   Screening Pap Test recommended every 3 years for all women age 76 to 4965, or every five years if combined with HPV test (routine screening not needed after total hysterectomy).  Medicare covers every 2 years, up to yearly if high risk.  Screening Pelvic Exam Medicare covers every 2 years, yearly if high risk or childbearing age with abnormal Pap in last 3 yrs. See your schedule below   Screening Mammogram    Recommended every 1-2 years for women age 76 to 7374, and selectively recommended for women between 940-49 based on shared decisions about risk. Covered by Medicare up to every year for women age 76 or older See your schedule below   Lung Cancer Screening  Annual low dose computed tomography (LDCT scan) is recommended for those age 855-77 who smoked 30 pack-years and are current smokers or quit smoking within past 15 years (one pack-year= smoking one PPD for one year), after counseling by your doctor or nurse clinician about the possible benefits or harms See your schedule below   Vaccinations   Pneumococcal Vaccine recommended routinely age 37+ with two separate vaccines one year apart (Prevnar then Pneumovax).  Recommended before age 76 if medical conditions increase risk  Seasonal Influenza Vaccine once every flu season   Hepatitis B Vaccine 3 doses if risk (including anyone with diabetes or liver disease)  Shingles Vaccine Once or twice at age 76 or older  Diphtheria Tetanus Pertussis Vaccine ONCE as adult, booster every 10 years     Immunization History   Administered Date(s) Administered   . Influenza Vaccine IM (ADMIN) 08/25/2017   . Prevnar 13 (Admin) 09/24/2017   . Shingrix - Zoster Vaccine (Admin) 06/22/2017     Shingles vaccine and Diphtheria Tetanus Pertussis vaccines are available at pharmacies or local health department without a prescription.  Other Screening   Bone Densitometry   every 24 months for anyone at risk, including postmenopausal       Glaucoma Screening   yearly if in high risk group such as diabetes, family history, African American age 76+ or Hispanic American age 18+      Hepatitis C Screening recommended ONCE for those born between 1945-1965, or high risk for HCV infection     HIV Testing   yearly or up to 3 times in pregnancy     Abdominal Aortic Aneurysm Screening Ultrasound   once with Initial Welcome to Medicare Physical within 12 months of coverage if  65 + with family history of AAA        Your Personalized Schedule for Preventive Tests   Health Maintenance: Pending and Last Completed       Date Due Completion Date    Adult Tdap-Td (1 - Tdap) 07/10/1960 ---    Osteoporosis screening 07/10/2006 ---    Shingles Vaccine (2 of 2) 08/17/2017 06/22/2017    Pneumococcal 65+ Years Low Risk (2 of 2 - PPSV23) 09/24/2018 09/24/2017    Depression Screening 09/24/2018 09/24/2017    Annual Wellness Exam 09/24/2018 09/24/2017

## 2017-12-16 ENCOUNTER — Ambulatory Visit (INDEPENDENT_AMBULATORY_CARE_PROVIDER_SITE_OTHER): Payer: Self-pay

## 2017-12-23 ENCOUNTER — Encounter (INDEPENDENT_AMBULATORY_CARE_PROVIDER_SITE_OTHER): Payer: Self-pay | Admitting: Family Medicine

## 2018-01-11 ENCOUNTER — Ambulatory Visit (INDEPENDENT_AMBULATORY_CARE_PROVIDER_SITE_OTHER): Payer: Self-pay

## 2018-01-18 ENCOUNTER — Encounter (INDEPENDENT_AMBULATORY_CARE_PROVIDER_SITE_OTHER): Payer: Self-pay | Admitting: Family Medicine

## 2018-11-11 ENCOUNTER — Other Ambulatory Visit (HOSPITAL_COMMUNITY): Payer: Self-pay | Admitting: FAMILY PRACTICE

## 2018-11-11 DIAGNOSIS — M8580 Other specified disorders of bone density and structure, unspecified site: Secondary | ICD-10-CM

## 2018-11-24 ENCOUNTER — Other Ambulatory Visit (HOSPITAL_COMMUNITY): Payer: Self-pay | Admitting: FAMILY PRACTICE

## 2018-11-24 DIAGNOSIS — M8580 Other specified disorders of bone density and structure, unspecified site: Secondary | ICD-10-CM

## 2018-11-24 DIAGNOSIS — M810 Age-related osteoporosis without current pathological fracture: Secondary | ICD-10-CM

## 2019-12-21 ENCOUNTER — Encounter: Payer: Self-pay | Admitting: General Practice

## 2020-02-22 ENCOUNTER — Encounter

## 2020-04-05 ENCOUNTER — Other Ambulatory Visit (INDEPENDENT_AMBULATORY_CARE_PROVIDER_SITE_OTHER): Payer: Self-pay | Admitting: Podiatrist

## 2020-04-05 DIAGNOSIS — M79671 Pain in right foot: Secondary | ICD-10-CM

## 2020-05-04 ENCOUNTER — Ambulatory Visit
Admission: RE | Admit: 2020-05-04 | Discharge: 2020-05-04 | Disposition: A | Discharge status: Home / Self Care | Payer: Medicare Other | Source: Ambulatory Visit | Attending: Podiatrist | Admitting: Podiatrist

## 2020-05-04 ENCOUNTER — Other Ambulatory Visit: Payer: Self-pay

## 2020-05-04 DIAGNOSIS — M79671 Pain in right foot: Secondary | ICD-10-CM | POA: Insufficient documentation

## 2020-05-10 ENCOUNTER — Encounter (INDEPENDENT_AMBULATORY_CARE_PROVIDER_SITE_OTHER): Payer: Self-pay | Admitting: Podiatrist

## 2020-05-10 ENCOUNTER — Other Ambulatory Visit: Payer: Self-pay

## 2020-05-10 ENCOUNTER — Ambulatory Visit: Payer: Medicare Other | Attending: Podiatrist | Admitting: Podiatrist

## 2020-05-10 VITALS — HR 77 | Temp 97.1°F | Resp 18 | Ht 66.0 in | Wt 131.0 lb

## 2020-05-10 DIAGNOSIS — B351 Tinea unguium: Secondary | ICD-10-CM

## 2020-05-10 DIAGNOSIS — M2011 Hallux valgus (acquired), right foot: Secondary | ICD-10-CM | POA: Insufficient documentation

## 2020-05-10 DIAGNOSIS — M2041 Other hammer toe(s) (acquired), right foot: Secondary | ICD-10-CM | POA: Insufficient documentation

## 2020-05-10 DIAGNOSIS — M79671 Pain in right foot: Secondary | ICD-10-CM

## 2020-05-10 DIAGNOSIS — M79672 Pain in left foot: Secondary | ICD-10-CM

## 2020-05-10 NOTE — Progress Notes (Signed)
CC: Right foot pain with thickened nails    HPI:  This 79 y.o. female with PMH indicated below presents today complaining of a painful curling toe right 2nd and 3rd x several months which has began to cause pain. Onset was gradual with worsening course in the last 3 months. Pain is sharp, rated as 3/10. Admits to increase in pain with pressure to the area and appears to exacerbated by rubbing from closed toe shoes. Pain is relieved with removal of shoe gear.  Relates occasional mild swelling but denies any redness. She denies use of analgesics. Has tried to modify shoe gear with some relief. She   admits to padding to the area. Patient relates that she is very unstable and does have to wear shoes at all times which will often increase her discomfort. She also relates that her nails have become thickened and elongated and she has a hard time getting to them to trim them herself. Denies any other pedal complaints.     Ernest Haber, MD    Past Medical History:   Diagnosis Date   . Past heart attack     take simvastatin      Current Outpatient Medications   Medication Sig   . aspirin (ECOTRIN) 81 mg Oral Tablet, Delayed Release (E.C.) Take 81 mg by mouth Once a day   . cholecalciferol, vitamin D3, (VITAMIN D) 1,000 unit Oral Tablet Take 1,000 Units by mouth Once a day   . conjugated estrogens (PREMARIN) 0.625 mg/gram Vaginal Cream 0.5 g by Vaginal route Per instructions 3 times a week   . simvastatin (ZOCOR) 40 mg Oral Tablet Take 40 mg by mouth Every evening       Allergies   Allergen Reactions   . Epinephrine      Nervous/jittery   . Cefdinir Nausea/ Vomiting     Past Surgical History:   Procedure Laterality Date   . HX CATARACT REMOVAL     . TRIGGER FINGER RELEASE       Social History     Tobacco Use   . Smoking status: Never Smoker   . Smokeless tobacco: Never Used   Substance Use Topics   . Alcohol use: No   . Drug use: Not on file     Family Medical History:     Problem Relation (Age of Onset)     Alzheimer's/Dementia Father    Heart Attack Brother, Paternal Grandfather    No Known Problems Sister, Maternal Grandmother, Maternal Grandfather    Ovarian Cancer Mother        REVIEW OF SYSTEMS  Constitutional: no weight loss, fever, night sweats  Cardiovascular: no complaints  Respiratory: no complaints  GI: no complaints  GU: no complaints  Neuro: negative  Psych: Denies any problems  Skin: no complaints  MSK: + as noted in HPI.    Physical examination:   On General Observation: Patient is a pleasant, cooperative, well developed 79 y.o.  adult female. The patient is alert and oriented to time, place and person. Patient has normal affect and mood.    Vascular:  DP and PT pulses are palpable.  CFT less than 3 seconds to all digits b/l.  Skin temperature is warm to warm from proximal to distal b/l.  Hair growth is noted. No edema noted . Significant varicosities noted.      Neuro:  Light touch intact b/l. Protective sensation intact at all pedal sites via [facility] 5.07 monofilament b/l.  Proprioception intact at the  hallux b/l.  No clonus noted. Babinski reflex not elicited b/l.    Derm:  Skin texture and turgor within normal limits.  Toenails 1-5 are thickened, discolored, and elongated with the presence of subungual debris.   Webspaces 1-4 clean, dry, intact b/l.  No rashes, subcutaneous nodules, or open lesions noted.  There is not hyperkeratotic tissue noted sub metatarsal head right foot..    Musc:  Decreased medial longitudinal arch.  Muscle strength is  5/5 for all muscle groups including dorsiflexors, plantarflexors, everters, and inverters b/l.  The 2-4 digit is contracted with a flexion contracture at the PIPJ as well as an extension type contracture at the metatarsophalangeal joint bilateral foot worse on the right than the left.  There are signs of chronic rubbing at the dorsal aspect of the proximal interphalangeal joint with increased lichenification and callus formation.  There is pain to  palpation of the proximal interphalangeal joint.  There is not callus formation noted under the adjacent metatarsophalangeal joint.  The deformity is reducible. HAV deformity noted bilaterally. There is no pain over this area.  Ankle joint ROM is decreased b/l, STJ, and MTJ ROM full without pain or crepitus b/l.      Radiographs: 3 views of the right foot  were taken today. Radiographs were reviewed and discussed with patient to patients understanding.     Radiographic impression: Digital contractures noted. Joint spaces narrowed. All cortices are intact.  No acute fxs or dislocations noted. No bony cyst or tumors noted. Degenerative changes noted to the midfoot bilaterally.     Impression: This is a 79 y.o. 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    Sherian was seen today for foot pain.    Diagnoses and all orders for this visit:    Right foot pain    Onychomycosis  -     11721 - DEBRIDEMENT OF NAIL BY ANY METHOD; 6 OR MORE (AMB ONLY); Future    Bilateral foot pain  -     11721 - DEBRIDEMENT OF NAIL BY ANY METHOD; 6 OR MORE (AMB ONLY); Future    Hammer toe of right foot    Hav (hallux abducto valgus), right        Plan:   A comprehensive history and physical examination were preformed. The patient was educated on clinical and radiographic findings, diagnosis and treatment plans. Patient state that she understands all that has been explained and all questions were answered to her apparent satisfaction.     Etiology and tx options, both conservative and surgical were discussed in detail with the pt.  Pt opts to pursue conservative measures at this time.    -Discussed shoe gear modification, analgesics and padding.   -Dispensed silipos tubing.  -Dispensed toe crest.  -Dispensed toe spacer silocone.  -In regards to nails, nails 1-5 were debrided in length and thickness with patient to patients  understanding.   -I discussed with patient she would benefit from compressive therapy and dispensed tubigrip for her to wear at all times.   -Patient may utilize some blue emu cream for her burning sensations.   -She will follow up in 4 weeks to assess progress.     Melina Schools, DPM

## 2020-05-10 NOTE — Nursing Note (Signed)
REVIEW OF SYSTEMS  Constitutional: no weight loss, fever, night sweats  Cardiovascular: no complaints  Respiratory: no complaints  GI: no complaints  GU: no complaints  Musculoskeletal: no complaints  Neuro: negative  Psych: Denies any problems  Skin: no complaints Jerilee Field, CNA  05/10/2020, 14:03     Patient presents today for foot pain and toe pain. Patient states its been on going for a few months now. Patient states she is not a diabetic. Patient rates her pain 3/10 right now. Patient voices no other concerns or complaints at this time. Jerilee Field, CNA  05/10/2020, 14:04

## 2020-05-17 ENCOUNTER — Ambulatory Visit (INDEPENDENT_AMBULATORY_CARE_PROVIDER_SITE_OTHER): Payer: Self-pay | Admitting: Podiatrist

## 2020-06-07 ENCOUNTER — Encounter (INDEPENDENT_AMBULATORY_CARE_PROVIDER_SITE_OTHER): Payer: Self-pay | Admitting: Podiatrist

## 2020-06-07 ENCOUNTER — Ambulatory Visit: Payer: Medicare Other | Attending: Podiatrist | Admitting: Podiatrist

## 2020-06-07 ENCOUNTER — Other Ambulatory Visit: Payer: Self-pay

## 2020-06-07 VITALS — HR 77 | Temp 97.1°F | Resp 17

## 2020-06-07 DIAGNOSIS — M19071 Primary osteoarthritis, right ankle and foot: Secondary | ICD-10-CM | POA: Insufficient documentation

## 2020-06-07 DIAGNOSIS — Z79899 Other long term (current) drug therapy: Secondary | ICD-10-CM | POA: Insufficient documentation

## 2020-06-07 DIAGNOSIS — M79671 Pain in right foot: Secondary | ICD-10-CM | POA: Insufficient documentation

## 2020-06-07 DIAGNOSIS — M2011 Hallux valgus (acquired), right foot: Secondary | ICD-10-CM | POA: Insufficient documentation

## 2020-06-07 DIAGNOSIS — I252 Old myocardial infarction: Secondary | ICD-10-CM | POA: Insufficient documentation

## 2020-06-07 DIAGNOSIS — M2142 Flat foot [pes planus] (acquired), left foot: Secondary | ICD-10-CM | POA: Insufficient documentation

## 2020-06-07 DIAGNOSIS — M79672 Pain in left foot: Secondary | ICD-10-CM | POA: Insufficient documentation

## 2020-06-07 DIAGNOSIS — M2141 Flat foot [pes planus] (acquired), right foot: Secondary | ICD-10-CM | POA: Insufficient documentation

## 2020-06-07 DIAGNOSIS — M2041 Other hammer toe(s) (acquired), right foot: Secondary | ICD-10-CM | POA: Insufficient documentation

## 2020-06-07 DIAGNOSIS — M19072 Primary osteoarthritis, left ankle and foot: Secondary | ICD-10-CM | POA: Insufficient documentation

## 2020-06-07 DIAGNOSIS — I872 Venous insufficiency (chronic) (peripheral): Secondary | ICD-10-CM | POA: Insufficient documentation

## 2020-06-07 DIAGNOSIS — B351 Tinea unguium: Secondary | ICD-10-CM | POA: Insufficient documentation

## 2020-06-07 DIAGNOSIS — Z7982 Long term (current) use of aspirin: Secondary | ICD-10-CM | POA: Insufficient documentation

## 2020-06-07 NOTE — Nursing Note (Signed)
Patient presents to clinic today for follow up right foot pain. Patient denies pain today. She states that her right 2nd toe seems to be doing a little bit better but the 3rd toe has started bothering her. She has toe sleeves on both of these toes upon arrival today. Leda Quail, LPN  12/02/7895, 84:78

## 2020-06-07 NOTE — Progress Notes (Signed)
CC: Right foot pain     HPI:  This 79 y.o. female with PMH indicated below presents today for follow-up to right foot pain in her 2nd and 3rd toes.  Patient states that she has been doing okay since her last visit.  She relates that she did utilize the toe spacer which did help some with her discomfort however her 3rd toe began to bother her more than therefore she return back to her from toe sleeves.  Patient rates her current pain at a 0/10 on the pain scale however if she does a lot of walking this will increase some.  Patient did not utilize the crest pads as she states she forgot about them.  She also relates that she did not utilize the blue emu cream because she looked online and this was very expensive for her. Denies any other pedal complaints.     Ernest Haber, MD    Past Medical History:   Diagnosis Date   . Past heart attack     take simvastatin      Current Outpatient Medications   Medication Sig   . aspirin (ECOTRIN) 81 mg Oral Tablet, Delayed Release (E.C.) Take 81 mg by mouth Once a day   . cholecalciferol, vitamin D3, (VITAMIN D) 1,000 unit Oral Tablet Take 1,000 Units by mouth Once a day   . conjugated estrogens (PREMARIN) 0.625 mg/gram Vaginal Cream 0.5 g by Vaginal route Per instructions 3 times a week   . simvastatin (ZOCOR) 40 mg Oral Tablet Take 40 mg by mouth Every evening       Allergies   Allergen Reactions   . Epinephrine      Nervous/jittery   . Cefdinir Nausea/ Vomiting     Past Surgical History:   Procedure Laterality Date   . HX CATARACT REMOVAL     . TRIGGER FINGER RELEASE       Family Medical History:     Problem Relation (Age of Onset)    Alzheimer's/Dementia Father    Heart Attack Brother, Paternal Grandfather    No Known Problems Sister, Maternal Grandmother, Maternal Grandfather    Ovarian Cancer Mother        Physical examination:   On General Observation: Patient is a pleasant, cooperative, well developed 79 y.o.  adult female. The patient is alert and oriented to time, place  and person. Patient has normal affect and mood.    Vascular:  DP and PT pulses are palpable.  CFT less than 3 seconds to all digits b/l.  Skin temperature is warm to warm from proximal to distal b/l.  Hair growth is noted. No edema noted . Significant varicosities noted.      Neuro:  Light touch intact b/l. Protective sensation intact at all pedal sites via [facility] 5.07 monofilament b/l.  Proprioception intact at the hallux b/l.  No clonus noted. Babinski reflex not elicited b/l.    Derm:  Skin texture and turgor within normal limits.  Toenails 1-5 are thickened and discolored with the presence of subungual debris.   Webspaces 1-4 clean, dry, intact b/l.  No rashes, subcutaneous nodules, or open lesions noted.  There is not hyperkeratotic tissue noted sub metatarsal head right foot.    Musc:  Decreased medial longitudinal arch.  Muscle strength is  5/5 for all muscle groups including dorsiflexors, plantarflexors, everters, and inverters b/l.  The 2-4 digit is contracted with a flexion contracture at the PIPJ as well as an extension type contracture at the  metatarsophalangeal joint bilateral foot worse on the right than the left.  There are signs of chronic rubbing at the dorsal aspect of the proximal interphalangeal joint with increased lichenification and callus formation.  There is discomfort on palpation of the distal tips of digits 2 and 3 to the right foot today.  No pain on palpation of the dorsal interphalangeal joint today.  There is not callus formation noted under the adjacent metatarsophalangeal joint.  The deformity is semi reducible. HAV deformity noted bilaterally. There is no pain over this area.  Ankle joint ROM is decreased b/l, STJ, and MTJ ROM full without pain or crepitus b/l.      Impression: This is a 79 y.o. 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    Tiffani was seen today  for foot pain.    Diagnoses and all orders for this visit:    Right foot pain    Bilateral foot pain    Hammer toe of right foot    Hav (hallux abducto valgus), right        Plan:   The patient was educated on clinical and radiographic findings, diagnosis and treatment plans. Patient state that she understands all that has been explained and all questions were answered to her apparent satisfaction.     Etiology and tx options, both conservative and surgical were discussed in detail with the pt.  Pt opts to pursue conservative measures at this time.    -Discussed shoe gear modification, analgesics and padding.   -I discussed with patient that I believe due to her symptoms today I believe she would benefit from a crest pad which would alleviate the pressure to the distal tips of digits 2 and 3.  Today I dispensed additional crest pads and instructed patient on use of these.  She voices understanding.  -I discussed with patient that she does have some equinus contraction would benefit from stretching exercises and dispensed Thera-Band stretching exercises instructed patient on use.  -I had a discussion with patient as to where she may obtain the blue emu cream for a cheaper price and would benefit from massaging this into her painful areas.  She also may perform stretching of the digits in order to alleviate pressure along the flexor tendons.  She voices understanding of this.  -She will follow up in 5 weeks to assess progress and fungal nail care.     Melina Schools, DPM

## 2020-07-19 ENCOUNTER — Encounter (INDEPENDENT_AMBULATORY_CARE_PROVIDER_SITE_OTHER): Payer: Self-pay | Admitting: Medical

## 2020-07-26 ENCOUNTER — Encounter (INDEPENDENT_AMBULATORY_CARE_PROVIDER_SITE_OTHER): Payer: Self-pay | Admitting: Podiatrist

## 2021-01-30 ENCOUNTER — Other Ambulatory Visit (INDEPENDENT_AMBULATORY_CARE_PROVIDER_SITE_OTHER): Payer: Self-pay | Admitting: Podiatrist

## 2021-01-30 DIAGNOSIS — M79671 Pain in right foot: Secondary | ICD-10-CM

## 2021-02-05 ENCOUNTER — Other Ambulatory Visit: Payer: Self-pay

## 2021-02-05 ENCOUNTER — Ambulatory Visit
Admission: RE | Admit: 2021-02-05 | Discharge: 2021-02-05 | Disposition: A | Discharge status: Home / Self Care | Payer: Medicare Other | Source: Ambulatory Visit | Attending: Podiatrist | Admitting: Podiatrist

## 2021-02-05 DIAGNOSIS — M79671 Pain in right foot: Secondary | ICD-10-CM | POA: Insufficient documentation

## 2021-02-07 ENCOUNTER — Encounter (INDEPENDENT_AMBULATORY_CARE_PROVIDER_SITE_OTHER): Payer: Self-pay | Admitting: Podiatrist

## 2021-02-07 ENCOUNTER — Ambulatory Visit: Payer: Medicare Other | Attending: Podiatrist | Admitting: Podiatrist

## 2021-02-07 ENCOUNTER — Other Ambulatory Visit: Payer: Self-pay

## 2021-02-07 VITALS — HR 66 | Temp 96.6°F | Resp 18

## 2021-02-07 DIAGNOSIS — M19071 Primary osteoarthritis, right ankle and foot: Secondary | ICD-10-CM | POA: Insufficient documentation

## 2021-02-07 DIAGNOSIS — M24574 Contracture, right foot: Secondary | ICD-10-CM | POA: Insufficient documentation

## 2021-02-07 DIAGNOSIS — B351 Tinea unguium: Secondary | ICD-10-CM | POA: Insufficient documentation

## 2021-02-07 DIAGNOSIS — M79675 Pain in left toe(s): Secondary | ICD-10-CM | POA: Insufficient documentation

## 2021-02-07 DIAGNOSIS — M19072 Primary osteoarthritis, left ankle and foot: Secondary | ICD-10-CM | POA: Insufficient documentation

## 2021-02-07 DIAGNOSIS — M2041 Other hammer toe(s) (acquired), right foot: Secondary | ICD-10-CM | POA: Insufficient documentation

## 2021-02-07 DIAGNOSIS — I872 Venous insufficiency (chronic) (peripheral): Secondary | ICD-10-CM | POA: Insufficient documentation

## 2021-02-07 DIAGNOSIS — M79671 Pain in right foot: Secondary | ICD-10-CM

## 2021-02-07 DIAGNOSIS — M79674 Pain in right toe(s): Secondary | ICD-10-CM | POA: Insufficient documentation

## 2021-02-07 DIAGNOSIS — M2140 Flat foot [pes planus] (acquired), unspecified foot: Secondary | ICD-10-CM | POA: Insufficient documentation

## 2021-02-07 DIAGNOSIS — M24575 Contracture, left foot: Secondary | ICD-10-CM | POA: Insufficient documentation

## 2021-02-07 DIAGNOSIS — M2011 Hallux valgus (acquired), right foot: Secondary | ICD-10-CM | POA: Insufficient documentation

## 2021-02-07 NOTE — Nursing Note (Signed)
Patient is here to follow up right foot and toe pain. States that she is not having any pain right now. States that she is wanting nail care. Voices no other complaints at this time.Courtney Mealy, CNA  02/07/2021, 14:00

## 2021-02-07 NOTE — Progress Notes (Signed)
CC: Right foot pain and R toe pain f/u    HPI:  This 80 y.o. female with PMH indicated below presents today for a follow-up to pain to the right foot and right 2nd and 3rd toes. Patient states that she has been doing okay since her last visit but relates that she still experiences intermittent pain that occurs between her right 2nd and 3rd digits. She mentions that she feels pain to both the nail and the toe. However, the patient rates her current pain at a 0/10 on the pain scale.  Patient also relates that all of her nails are thickened and elongated which makes it very difficult for her to trim them and this causes some increased pain.  Denies any other pedal complaints.     Ernest Haber, MD   Last Visit: 09/24/17    Past Medical History:   Diagnosis Date   . Past heart attack     take simvastatin      Current Outpatient Medications   Medication Sig   . aspirin (ECOTRIN) 81 mg Oral Tablet, Delayed Release (E.C.) Take 81 mg by mouth Once a day   . cholecalciferol, vitamin D3, 25 mcg (1,000 unit) Oral Tablet Take 1,000 Units by mouth Once a day   . conjugated estrogens (PREMARIN) 0.625 mg/gram Vaginal Cream Insert 0.5 g into the vagina Per instructions 3 times a week   . simvastatin (ZOCOR) 40 mg Oral Tablet Take 40 mg by mouth Every evening       Allergies   Allergen Reactions   . Epinephrine      Nervous/jittery   . Cefdinir Nausea/ Vomiting     Past Surgical History:   Procedure Laterality Date   . HX CATARACT REMOVAL     . TRIGGER FINGER RELEASE       Family Medical History:     Problem Relation (Age of Onset)    Alzheimer's/Dementia Father    Heart Attack Brother, Paternal Grandfather    No Known Problems Sister, Maternal Grandmother, Maternal Grandfather    Ovarian Cancer Mother        Physical examination:   On General Observation: Patient is a pleasant, cooperative, well developed 80 y.o. adult female. The patient is alert and oriented to time, place and person. Patient has normal affect and  mood.    Vascular:  DP and PT pulses are palpable. CFT less than 3 seconds to all digits b/l.  Skin temperature is warm to warm from proximal to distal b/l.  Hair growth is noted. No edema noted. Significant varicosities noted.      Neuro:  Light touch intact b/l. Protective sensation intact at all pedal sites via [facility] 5.07 monofilament b/l.  Proprioception intact at the hallux b/l.  No clonus noted. Babinski reflex not elicited b/l.    Derm:  Skin texture and turgor within normal limits.  Toenails 1-5 b/l are thickened, elongated, dystrophic and discolored with the presence of subungual debris.  There is more so significant thickening noted to the 2nd toenail on the right foot.  Webspaces 1-4 clean, dry, intact b/l. No rashes, subcutaneous nodules, or open lesions noted. There is not hyperkeratotic tissue noted sub metatarsal head right foot.    Musc:  Decreased medial longitudinal arch.  Muscle strength is  5/5 for all muscle groups including dorsiflexors, plantarflexors, everters, and inverters b/l.  The 2-4 digit is contracted with a flexion contracture at the PIPJ as well as an extension type contracture at the metatarsophalangeal  joint bilateral foot worse on the right than the left. There are signs of chronic rubbing at the dorsal aspect of the proximal interphalangeal joint with increased lichenification and callus formation.  There is discomfort on palpation of the distal tips of digits 2 and 3 to the right foot today.  No pain on palpation of the dorsal interphalangeal joint today.  There is not callus formation noted under the adjacent metatarsophalangeal joint.  The deformity is semi reducible. HAV deformity noted bilaterally. There is no pain over this area.  Ankle joint ROM is decreased b/l, STJ, and MTJ ROM full without pain or crepitus b/l.      Impression: This is a 80 y.o. 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    Wylie was seen today for foot pain.    Diagnoses and all orders for this visit:    Foot pain, right    Right foot pain    Hammer toe of right foot    Hav (hallux abducto valgus), right        Plan:   The patient was educated on clinical and radiographic findings, diagnosis and treatment plans. Patient state that she understands all that has been explained and all questions were answered to her apparent satisfaction.     - Etiology and tx options, both conservative and surgical were discussed in detail with the pt.  Pt opts to pursue conservative measures at this time.    -Discussed shoe gear modification, analgesics and padding.   - Nails 1-5 b/l were sharply debrided today. See procedure note.  - Advised pt to apply Vicks vapor rub  - Toe sleeves were dispensed to the pt   - Follow up in 9 weeks     I am scribing for, and in the presence of, Dr. Casimiro Needle for services provided on 02/07/2021  Elvera Bicker, SCRIBE     I personally performed the services described in this documentation, as scribed  in my presence, and it is both accurate  and complete.    Melina Schools, DPM

## 2021-03-07 ENCOUNTER — Other Ambulatory Visit: Payer: Self-pay

## 2021-03-07 ENCOUNTER — Encounter (INDEPENDENT_AMBULATORY_CARE_PROVIDER_SITE_OTHER): Payer: Self-pay

## 2021-03-07 ENCOUNTER — Encounter (INDEPENDENT_AMBULATORY_CARE_PROVIDER_SITE_OTHER): Payer: Self-pay | Admitting: Family Medicine

## 2021-03-07 ENCOUNTER — Ambulatory Visit (INDEPENDENT_AMBULATORY_CARE_PROVIDER_SITE_OTHER): Payer: Medicare Other | Admitting: Family Medicine

## 2021-03-07 ENCOUNTER — Other Ambulatory Visit: Payer: Medicare Other | Attending: Family Medicine | Admitting: Family Medicine

## 2021-03-07 VITALS — BP 127/74 | HR 77 | Temp 97.2°F | Resp 16 | Ht 66.5 in | Wt 108.2 lb

## 2021-03-07 DIAGNOSIS — K5900 Constipation, unspecified: Secondary | ICD-10-CM

## 2021-03-07 DIAGNOSIS — Z681 Body mass index (BMI) 19 or less, adult: Secondary | ICD-10-CM

## 2021-03-07 DIAGNOSIS — L989 Disorder of the skin and subcutaneous tissue, unspecified: Secondary | ICD-10-CM

## 2021-03-07 DIAGNOSIS — E559 Vitamin D deficiency, unspecified: Secondary | ICD-10-CM

## 2021-03-07 DIAGNOSIS — G252 Other specified forms of tremor: Secondary | ICD-10-CM

## 2021-03-07 DIAGNOSIS — Z0000 Encounter for general adult medical examination without abnormal findings: Secondary | ICD-10-CM

## 2021-03-07 LAB — CBC WITH DIFF
BASOPHIL #: <0.1 10*3/uL (ref <=0.20)
BASOPHIL %: 1 %
EOSINOPHIL #: <0.1 10*3/uL (ref <=0.50)
EOSINOPHIL %: 0 %
HCT: 39 % (ref 34.8–46.0)
HGB: 12.3 g/dL (ref 11.5–16.0)
IMMATURE GRANULOCYTE #: <0.1 10*3/uL (ref <0.10)
IMMATURE GRANULOCYTE %: 0 % (ref 0–1)
LYMPHOCYTE #: 1.38 10*3/uL (ref 1.00–4.80)
LYMPHOCYTE %: 25 %
MCH: 28.9 pg (ref 26.0–32.0)
MCHC: 31.5 g/dL (ref 31.0–35.5)
MCV: 91.5 fL (ref 78.0–100.0)
MONOCYTE #: 0.38 10*3/uL (ref 0.20–1.10)
MONOCYTE %: 7 %
MPV: 11.4 fL (ref 8.7–12.5)
NEUTROPHIL #: 3.65 10*3/uL (ref 1.50–7.70)
NEUTROPHIL %: 67 %
PLATELETS: 175 10*3/uL (ref 150–400)
RBC: 4.26 10*6/uL (ref 3.85–5.22)
RDW-CV: 13.7 % (ref 11.5–15.5)
WBC: 5.5 10*3/uL (ref 3.7–11.0)

## 2021-03-07 LAB — COMPREHENSIVE METABOLIC PANEL, NON-FASTING
ALBUMIN: 4.3 g/dL (ref 3.2–4.8)
ALKALINE PHOSPHATASE: 48 U/L (ref 20–130)
ALT (SGPT): 11 U/L (ref <=52)
ANION GAP: 8 mmol/L
AST (SGOT): 22 U/L (ref <=35)
BILIRUBIN TOTAL: 0.6 mg/dL (ref 0.3–1.2)
BUN/CREA RATIO: 26
BUN: 22 mg/dL (ref 10–25)
CALCIUM: 9.4 mg/dL (ref 8.8–10.3)
CHLORIDE: 107 mmol/L (ref 98–111)
CO2 TOTAL: 26 mmol/L (ref 21–35)
CREATININE: 0.84 mg/dL (ref <=1.30)
ESTIMATED GFR: >60 mL/min/{1.73_m2}
GLUCOSE: 81 mg/dL (ref 70–110)
POTASSIUM: 4.2 mmol/L (ref 3.5–5.0)
PROTEIN TOTAL: 6.7 g/dL (ref 6.0–8.3)
SODIUM: 141 mmol/L (ref 135–145)

## 2021-03-07 LAB — VITAMIN D: VITAMIN D: 51 ng/mL (ref 30–100)

## 2021-03-07 NOTE — Progress Notes (Signed)
Name: Courtney [name]   Date of Birth: 11-20-1940  Date of Service: 03/07/2021  MRN: [id number]    Chief Complaint:   Chief Complaint   Patient presents with   . Establish Care   . Medicare Annual       Subjective: Courtney [name] is a 80 y.o. female presenting to establish care.  Patient's past medical, social, medications, allergies, and family history were all reviewed reconciled.  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  She does get Klonopin from Neurology to help with her symptoms.  She also takes vitamin-D daily a 1000 mg daily.  She is having some complaints today with constipation that has been frequent lately.  She states she will have normal bowel movements for several days and then go 2 or 3 days without a bowel movement and had some abdominal pain.  She has not tried anything over-the-counter for symptoms.  She has not tried to change her diet.  She also has a lesion on her scalp that is caused her some irritation and made it difficult to fix her hair.  She states it has changed over the last several weeks to months.  She is unsure of what is could she cannot see the top of her scalp.    Past Medical History:   Diagnosis Date   . Past heart attack     take simvastatin    . Tremor         Past Surgical History:   Procedure Laterality Date   . HX CATARACT REMOVAL     . TRIGGER FINGER RELEASE            Allergies   Allergen Reactions   . Epinephrine      Nervous/jittery   . Cefdinir Nausea/ Vomiting    Current Outpatient Medications   Medication Sig   . cholecalciferol, vitamin D3, 25 mcg (1,000 unit) Oral Tablet Take 1,000 Units by mouth Once a day   . clonazePAM (KLONOPIN) 0.5 mg Oral Tablet Take 0.5 mg by mouth Once a day   . conjugated estrogens (PREMARIN) 0.625 mg/gram Vaginal Cream Insert 0.5 g into the vagina Per instructions 3 times a week        BP 127/74   Pulse 77   Temp 36.2 C (97.2 F)   Resp 16   Ht 1.689 m (5' 6.5")   Wt 49.1 kg (108 lb 3.2 oz)   SpO2  99%   BMI 17.20 kg/m           Review of System:  Constitutional:within normal limits   HEENT: within normal limits  Respiratory: within normal limits  Cardiovascular: within normal limits  Gastrointestinal: Constipation   Musculoskeletal: within normal limits   Neurological: within normal limits   Psych: normal mood and affect  All other ROS Negative    Objective:  Constitutional: well developed well nourished, in no acute distress  HEENT: Conjunctiva normal, external ears normal, no nasal drainage  Neck: Normal alignment, FROM, full strength, no instability  Cardiac: RRR, no murmurs, rubs or gallops  Pulmonary: Clear bilaterally  ABD: non distended, normal BS, soft nontender   MS:  Grossly normal  Neuro: negative  Skin: Large thick scaly lesion of her scalp with hair growing out of it, no sign of infection  Psych: normal mood and affect    Plan/Assessment:   2 ICD-10-CM    1. Medicare annual wellness visit, subsequent  Z00.00 CBC/DIFF     COMPREHENSIVE  METABOLIC PANEL, NON-FASTING   2. 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 Constipation, unspecified constipation type  K59.00 CBC/DIFF   4. Scalp lesion  L98.9    5. Vitamin D deficiency  E55.9 CBC/DIFF     Vitamin D ([place]/STJ)   6. BMI less than 19,adult  Z68.1        Will place a referral for the the patient to see Dermatology for scalp lesion.  Also instructed to increase her water intake and to start using using MiraLax when she is having symptoms of her constipation.  She was also instructed to continue her vitamin-D.  We will perform screening labs today.  She will continue follow-up with Neurology.    Rick Duffarey F Kooper Chriswell, DO  03/07/2021, 14:27    [facility] [place]  [address]  [place] [id number]    Medicare Annual Wellness Visit    Name: Courtney [name] [name] MRN:  [id number]   Date: 03/07/2021 Age: 80 y.o.       SUBJECTIVE:   Courtney [name] Zeman is a 80 y.o. female for presenting for Medicare Wellness exam.   I have reviewed and  reconciled the medication list with the patient today.    Comprehensive Health Assessment:  Paper document COMPREHENSIVE HEALTH ASSESSMENT reviewed, signed and scanned into medical record    I have reviewed and updated as appropriate the past medical, family and social history. 03/07/2021 as summarized below:  Past Medical History:   Diagnosis Date   . Past heart attack     take simvastatin    . Tremor      Past Surgical History:   Procedure Laterality Date   . Hx cataract removal     . Trigger finger release       Current Outpatient Medications   Medication Sig   . cholecalciferol, vitamin D3, 25 mcg (1,000 unit) Oral Tablet Take 1,000 Units by mouth Once a day   . clonazePAM (KLONOPIN) 0.5 mg Oral Tablet Take 0.5 mg by mouth Once a day   . conjugated estrogens (PREMARIN) 0.625 mg/gram Vaginal Cream Insert 0.5 g into the vagina Per instructions 3 times a week     Family Medical History:     Problem Relation (Age of Onset)    Alzheimer's/Dementia Father    Heart Attack Brother, Paternal Grandfather    No Known Problems Sister, Maternal Grandmother, Maternal Grandfather    Ovarian Cancer Mother          Social History     Socioeconomic History   . Marital status: Widowed   Tobacco Use   . Smoking status: Never Smoker   . Smokeless tobacco: Never Used   Substance and Sexual Activity   . Alcohol use: No     Review of Systems: Pertinent items are noted in HPI.     List of Current Health Care Providers   Care Team     PCP     Name Type Specialty Phone Number    Rick DuffSwisher, Emeterio Balke F, DO Physician [facility] [phone]          Care Team     No care team found                  Health Maintenance   Topic Date Due   . Osteoporosis screening  Never done   . Adult Tdap-Td (1 - Tdap) Never done   . Pneumococcal Vaccination, Age 61+ (2 - PPSV23 or PCV20) 09/24/2018   .  Covid-19 Vaccine (2 - Pfizer 3-dose series) 07/25/2020   . Influenza Vaccine (Season Ended) 05/30/2021   . Depression Screening  03/07/2022   . Annual Wellness  Visit  03/07/2022   . Shingles Vaccine  Completed   . Meningococcal Vaccine  Aged Out     Medicare Wellness Assessment   Medicare initial or wellness physical in the last year?: No  Advance Directives (optional)   Does patient have a living will or MPOA: YES   Has patient provided [employer] with a copy?: no   Advance directive information given to the patient today?: no      Activities of Daily Living   Do you need help with dressing, bathing, or walking?: No   Do you need help with shopping, housekeeping, medications, or finances?: No   Do you have rugs in hallways, broken steps, or poor lighting?: No   Do you have grab bars in your bathroom, non-slip strips in your tub, and hand rails on your stairs?: Yes   Urinary Incontinence Screen (Women >=65 only)   Do you ever leak urine when you don't want to?: YES   Cognitive Function Screen (1=Yes, 0=No)   What is you age?: Correct   What is the time to the nearest hour?: Correct   What is the year?: Correct   What is the name of this clinic?: Correct   Can the patient recognize two persons (the doctor, the nurse, home help, etc.)?: Correct   What is the date of your birth? (day and month sufficient) : Correct   In what year did World War II end?: Correct   Who is the current president of the [place]?: Correct   Count from 20 down to 1?: Correct   What address did I give you earlier?: Correct   Total Score: 10       Hearing Screen   Have you noticed any hearing difficulties?: No   Fall Risk Screen   Do you feel unsteady when standing or walking?: Yes  Do you worry about falling?: Yes  Have you fallen in the past year?: No   Vision Screen           Depression Screen   Little interest or pleasure in doing things.: Not at all  Feeling down, depressed, or hopeless: Not at all  PHQ 2 Total: 0        OBJECTIVE:   BP 127/74   Pulse 77   Temp 36.2 C (97.2 F)   Resp 16   Ht 1.689 m (5' 6.5")   Wt 49.1 kg (108 lb 3.2 oz)   SpO2 99%   BMI 17.20 kg/m        Other  appropriate exam:    Health Maintenance Due   Topic Date Due   . Osteoporosis screening  Never done   . Adult Tdap-Td (1 - Tdap) Never done   . Pneumococcal Vaccination, Age 28+ (2 - PPSV23 or PCV20) 09/24/2018   . Covid-19 Vaccine (2 - Pfizer 3-dose series) 07/25/2020      ASSESSMENT & PLAN:   1. Medicare annual wellness visit, subsequent    2. Postural tremor    3. Constipation, unspecified constipation type    4. Scalp lesion    5. Vitamin D deficiency    6. BMI less than 19,adult       Identified Risk Factors/ Recommended Actions     Fall Risk Follow up plan of care: Discussed optimizing home safety  The  PHQ 2 Total: 0 depression screen is interpreted as negative.  Urinary Incontinence Plan of Care: Lifestyle modifications  Orders Placed This Encounter   . CBC/DIFF   . COMPREHENSIVE METABOLIC PANEL, NON-FASTING   . Vitamin D ([place]/STJ)   . CBC WITH DIFF          The patient has been educated about risk factors and recommended preventive care. Written Prevention Plan completed/ updated and given to patient (see After Visit Summary).    Return in about 1 year (around 03/07/2022).    Rick Duff, DO  [facility]IC[facility], [facility] [place]  [address]  [place] [address]  Phone: [phone]  Fax: [phone]

## 2021-03-07 NOTE — Nursing Note (Signed)
03/07/21 1424   Adult Vitals   Initials TS   BP (Non-Invasive) 127/74   Temperature 36.2 C (97.2 F)   Heart Rate 77   Respiratory Rate 16   SpO2 99 %   Body Measurements   Height 1.689 m (5' 6.5")   Weight 49.1 kg (108 lb 3.2 oz)   BMI (Calculated) 17.24

## 2021-03-07 NOTE — Patient Instructions (Signed)
Medicare Preventive Services  Medicare coverage information Recommendation for YOU   Heart Disease and Diabetes   Lipid profile Every 5 years or more often if at risk for cardiovascular disease     Diabetes Screening  yearly for those at risk for diabetes, 2 tests per year for those with prediabetes Last Glucose:      Diabetes Self Management Training or Medical Nutrition Therapy  For those with diabetes, up to 10 hrs initial training within a year, subsequent years up to 2 hrs of follow up training Optional for those with diabetes     Medical Nutrition Therapy Three hours of one-on-one counseling in first year, two hours in subsequent years Optional for those with diabetes, kidney disease   Intensive Behavioral Therapy for Obesity  Face-to-face counseling, first month every week, month 2-6 every other week, month 7-12 every month if continued progress is documented Optional for those with Body Mass Index 30 or higher  Your Body mass index is 17.2 kg/m.   Tobacco Cessation (Quitting) Counseling   Two attempts per year, max 4 sessions per attempt, up to 8 per year, for those with tobacco-related health condition Optional for those that use tobacco   Cancer Screening   Colorectal screening   For anyone age 66 to 18 or any age if high risk:  . Screening Colonoscopy every 10 yrs if low risk,  more frequent if higher risk  OR  . Flexible  Sigmoidoscopy  every 5 yr OR  . Fecal Occult Blood Testing yearly OR  . Cologuard Stool DNA test once every 3 years OR  . CT Colonography every 5 yrs    See your schedule below   Screening Pap Test Recommended every 3 years for all women age 67 to 10, or every five years if combined with HPV test (routine screening not needed after total hysterectomy).  Medicare covers every 2 years, up to yearly if high risk.  Screening Pelvic Exam Medicare covers every 2 years, yearly if high risk or childbearing age with abnormal Pap in last 3 yrs. See your schedule below   Screening Mammogram    Recommended every 1-2 years for women age 74 to 63, and selectively recommended for women between 59-49 based on shared decisions about risk. Covered by Medicare up to every year for women age 30 or older See your schedule below   Lung Cancer Screening  Annual low dose computed tomography (LDCT scan) is recommended for those age 31-77 who smoked 30 pack-years and are current smokers or quit smoking within past 15 years (one pack-year= smoking one PPD for one year), after counseling by your doctor or nurse clinician about the possible benefits or harms See your schedule below   Vaccinations   Pneumococcal Vaccine Recommended routinely age 45+ with two separate vaccines one year apart (Prevnar then Pneumovax).  Recommended before age 57 if medical conditions increase risk  Seasonal Influenza Vaccine Once every flu season   Hepatitis B Vaccine 3 doses if risk (including anyone with diabetes or liver disease)  Shingles Vaccine Once or twice at age 1 or older  Diphtheria Tetanus Pertussis Vaccine ONCE as adult, booster every 10 years     Immunization History   Administered Date(s) Administered   . Covid-19 Vaccine,Pfizer-BioNTech,Purple Top,75yrs+ 07/04/2020   . Influenza Vaccine, 6 month-adult 08/25/2017   . PREVNAR 13 (ADMIN) 09/24/2017   . Shingrix - Zoster Vaccine (Admin) 06/22/2017, 10/01/2017     Shingles vaccine and Diphtheria Tetanus Pertussis vaccines are available  at pharmacies or local health department without a prescription.   Other Screening   Bone Densitometry   Every 24 months for anyone at risk, including postmenopausal       Glaucoma Screening   Yearly if in high risk group such as diabetes, family history, African American age 21+ or Hispanic American age 36+      Hepatitis C Screening recommended ONCE for those born between 1945-1965, or high risk for HCV infection       HIV Testing recommended routinely at least ONCE, covered every year for age 51 to 95 regardless of risk, and every year for age  over 89 who ask for the test or higher risk  Yearly or up to 3 times in pregnancy         Abdominal Aortic Aneurysm Screening Ultrasound   Once between the age of 72-75 with a family history of AAA       Your Personalized Schedule for Preventive Tests   Health Maintenance: Pending and Last Completed       Date Due Completion Date    Osteoporosis screening Never done ---    Adult Tdap-Td (1 - Tdap) Never done ---    Pneumococcal Vaccination, Age 47+ (2 - PPSV23 or PCV20) 09/24/2018 09/24/2017    Covid-19 Vaccine (2 - Pfizer 3-dose series) 07/25/2020 07/04/2020    Influenza Vaccine (Season Ended) 05/30/2021 08/25/2017    Depression Screening 03/07/2022 03/07/2021    Annual Wellness Visit 03/07/2022 03/07/2021

## 2021-03-07 NOTE — Nursing Note (Signed)
03/07/21 1400   Medicare Wellness Assessment   Medicare initial or wellness physical in the last year? No   Advance Directives   Does patient have a living will or MPOA YES   Has patient provided [employer] with a copy? no   Advance directive information given to the patient today? no   Activities of Daily Living   Do you need help with dressing, bathing, or walking? No   Do you need help with shopping, housekeeping, medications, or finances? No   Do you have rugs in hallways, broken steps, or poor lighting? No   Do you have grab bars in your bathroom, non-slip strips in your tub, and hand rails on your stairs? Yes   Urinary Incontinence Screen-Women only   Do you ever leak urine when you don't want to? YES   Cognitive Function Screen   What is you age? 1   What is the time to the nearest hour? 1   What is the year? 1   What is the name of this clinic? 1   Can the patient recognize two persons (the doctor, the nurse, home help, etc.)? 1   What is the date of your birth? (day and month sufficient)  1   In what year did World War II end? 1   Who is the current president of the [place]? 1   Count from 20 down to 1? 1   What address did I give you earlier? 1   Total Score 10   Depression Screen   Little interest or pleasure in doing things. 0   Feeling down, depressed, or hopeless 0   PHQ 2 Total 0   Hearing Screen   Have you noticed any hearing difficulties? No   Fall Risk Assessment   Do you feel unsteady when standing or walking? Yes   Do you worry about falling? Yes   Have you fallen in the past year? No

## 2021-03-11 ENCOUNTER — Other Ambulatory Visit (INDEPENDENT_AMBULATORY_CARE_PROVIDER_SITE_OTHER): Payer: Self-pay | Admitting: Family Medicine

## 2021-04-11 ENCOUNTER — Encounter (INDEPENDENT_AMBULATORY_CARE_PROVIDER_SITE_OTHER): Payer: Self-pay | Admitting: Podiatrist

## 2021-05-30 ENCOUNTER — Encounter (INDEPENDENT_AMBULATORY_CARE_PROVIDER_SITE_OTHER): Payer: Self-pay | Admitting: Family Medicine

## 2021-06-11 ENCOUNTER — Ambulatory Visit (INDEPENDENT_AMBULATORY_CARE_PROVIDER_SITE_OTHER): Payer: Medicare Other | Admitting: Family Medicine

## 2021-06-11 ENCOUNTER — Other Ambulatory Visit: Payer: Self-pay

## 2021-06-11 ENCOUNTER — Encounter (INDEPENDENT_AMBULATORY_CARE_PROVIDER_SITE_OTHER): Payer: Self-pay | Admitting: Family Medicine

## 2021-06-11 VITALS — BP 130/77 | HR 81 | Temp 97.0°F | Resp 18 | Ht 66.0 in | Wt 107.4 lb

## 2021-06-11 DIAGNOSIS — R634 Abnormal weight loss: Secondary | ICD-10-CM

## 2021-06-11 DIAGNOSIS — L989 Disorder of the skin and subcutaneous tissue, unspecified: Secondary | ICD-10-CM

## 2021-06-11 DIAGNOSIS — Z681 Body mass index (BMI) 19 or less, adult: Secondary | ICD-10-CM

## 2021-06-11 NOTE — Nursing Note (Signed)
06/11/21 1030   Adult Vitals   Initials jp   BP (Non-Invasive) 130/77   Temperature 36.1 C (97 F)   Heart Rate 81   Respiratory Rate 18   SpO2 98 %   Body Measurements   Height 1.676 m (5\' 6" )   Weight 48.7 kg (107 lb 6.4 oz)   BMI (Calculated) 17.37

## 2021-06-11 NOTE — Progress Notes (Signed)
Name: Courtney [name]   Date of Birth: [birth year]  Date of Service: 06/11/2021  MRN: [id number]    Chief Complaint:   Chief Complaint   Patient presents with   . Weight Loss       Subjective: Courtney [name] is a 80 y.o. female presenting for weight loss.  Patient states approximately a year ago she had about a 10 lb weight loss.  She states she did have a normal colonoscopy, CT of her abdomen and pelvis, and pelvic ultrasound at that time.  She denies that she has had any other significant weight loss but is having difficulty gaining the weight back.  She is wondering what we can do to help with weight gain.  She states she does try to eat 3 meals a day but admits she does not eat a lot except for at dinnertime.  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  She denies any depression.  She does not wish to take any medication for her anxiety.  She has not tried any supplementation for meals to see if this would help with her weight gain.  She has not tried to increase her calorie intake.  She denies any symptoms associated with her weight loss.  She is up-to-date on all of her screening except for mammogram and does not wish to schedule it at this time.    Past Medical History:   Diagnosis Date   . Past heart attack     take simvastatin    . Tremor         Past Surgical History:   Procedure Laterality Date   . HX CATARACT REMOVAL     . TRIGGER FINGER RELEASE            Allergies   Allergen Reactions   . Epinephrine      Nervous/jittery   . Cefdinir Nausea/ Vomiting    Current Outpatient Medications   Medication Sig   . aspirin (ECOTRIN) 81 mg Oral Tablet, Delayed Release (E.C.) Take 81 mg by mouth Once a day   . cholecalciferol, vitamin D3, 25 mcg (1,000 unit) Oral Tablet Take 1,000 Units by mouth Once a day   . clonazePAM (KLONOPIN) 0.5 mg Oral Tablet Take 0.5 mg by mouth Once a day   . conjugated estrogens (PREMARIN) 0.625 mg/gram Vaginal Cream Insert 0.5 g into the vagina Per instructions 3  times a week        BP 130/77   Pulse 81   Temp 36.1 C (97 F)   Resp 18   Ht 1.676 m (5\' 6" )   Wt 48.7 kg (107 lb 6.4 oz)   SpO2 98%   BMI 17.33 kg/m           Review of System:  Constitutional:weight loss   HEENT: within normal limits  Respiratory: within normal limits  Cardiovascular: within normal limits  Gastrointestinal: within normal limits   Musculoskeletal: within normal limits   Neurological: tremor   Psych: normal mood and affect  All other ROS Negative    Objective:  Constitutional: in no acute distress  HEENT: Conjunctiva normal, external ears normal, no nasal drainage  Neck: Normal alignment, FROM, full strength, no instability  Cardiac: RRR, no murmurs, rubs or gallops  Pulmonary: Clear bilaterally  ABD: non distended, normal BS   MS:  Patient ambulates with a cane  Neuro: negative  Skin: normal, no sign of infection  Psych: normal mood and affect    COMPLETE  BLOOD COUNT   Lab Results   Component Value Date    WBC 5.5 03/07/2021    HGB 12.3 03/07/2021    HCT 39.0 03/07/2021    PLTCNT 175 03/07/2021       DIFFERENTIAL  Lab Results   Component Value Date    PMNS 67 03/07/2021    MONOCYTES 7 03/07/2021    BASOPHILS 1 03/07/2021    BASOPHILS <0.10 03/07/2021    PMNABS 3.65 03/07/2021    LYMPHSABS 1.38 03/07/2021    EOSABS <0.10 03/07/2021    MONOSABS 0.38 03/07/2021     COMPREHENSIVE METABOLIC PANEL - NON FASTING  Lab Results   Component Value Date    SODIUM 141 03/07/2021    POTASSIUM 4.2 03/07/2021    CHLORIDE 107 03/07/2021    CO2 26 03/07/2021    ANIONGAP 8 03/07/2021    BUN 22 03/07/2021    CREATININE 0.84 03/07/2021    GLUCOSENF 81 03/07/2021    CALCIUM 9.4 03/07/2021    ALBUMIN 4.3 03/07/2021    TOTALPROTEIN 6.7 03/07/2021    ALKPHOS 48 03/07/2021    AST 22 03/07/2021    ALT 11 03/07/2021       Plan/Assessment:    ICD-10-CM    1. Weight loss  R63.4    2. Scalp lesion  L98.9    3. BMI less than 19,adult  Z68.1        We did discuss we can try increasing her calorie intake and she was given  samples of Ensure to try.  We also discussed starting medications that would help with weight gain and her anxiety.  She does not wish to do any medications at this time but we did discuss she could try counseling.  Her scalp lesion appears to be healing well since her biopsy and she will continue to follow-up with dermatology as directed by them.    Rick Duff, DO  06/11/2021, 10:29

## 2021-07-03 ENCOUNTER — Telehealth (INDEPENDENT_AMBULATORY_CARE_PROVIDER_SITE_OTHER): Payer: Self-pay | Admitting: Family Medicine

## 2021-07-03 DIAGNOSIS — Z1231 Encounter for screening mammogram for malignant neoplasm of breast: Secondary | ICD-10-CM

## 2021-07-03 NOTE — Telephone Encounter (Signed)
07/03/2021 Patient called requesting mammogram be scheduled at [facility] in the afternoon.  She tihnks her last mammogram was in 2019.  Courtney [name]

## 2021-07-15 ENCOUNTER — Other Ambulatory Visit (INDEPENDENT_AMBULATORY_CARE_PROVIDER_SITE_OTHER): Payer: Self-pay | Admitting: Family Medicine

## 2021-09-06 ENCOUNTER — Other Ambulatory Visit: Payer: Self-pay

## 2021-09-06 ENCOUNTER — Encounter (INDEPENDENT_AMBULATORY_CARE_PROVIDER_SITE_OTHER): Payer: Self-pay | Admitting: Family Medicine

## 2021-09-06 ENCOUNTER — Telehealth: Payer: Medicare Other | Admitting: Family Medicine

## 2021-09-06 DIAGNOSIS — J029 Acute pharyngitis, unspecified: Secondary | ICD-10-CM

## 2021-09-06 DIAGNOSIS — R0981 Nasal congestion: Secondary | ICD-10-CM

## 2021-09-06 MED ORDER — PREDNISONE 10 MG TABLET
ORAL_TABLET | ORAL | 0 refills | Status: DC
Start: 2021-09-06 — End: 2021-11-12

## 2021-09-06 NOTE — Progress Notes (Signed)
Courtney [name]  female  03/23/1941  [id number]    Date of Service: 09/06/2021 10:30 AM EST      Subjective:       Courtney [name] is here with the complaint of   Chief Complaint   Patient presents with   . Congestion     All symptoms started Wednesday    . Sore Throat     This has been going on for 2days. The patient denies fever.  There is Mild sore throat There is a non-productive cough. There is facial congestion. The patient deniesof ear discomfort. Others at home or work are not ill.     ROS:  Shortness of breath  No  Rash  no  chest pain.no  no nausea and no vomiting  Diarrhea no  abominal pain  no  Fatigue  yes  Headache  yes  Patient has tried nothing      Past Medical History:   Diagnosis Date   . Past heart attack     take simvastatin    . Tremor          Current Outpatient Medications   Medication Sig   . aspirin (ECOTRIN) 81 mg Oral Tablet, Delayed Release (E.C.) Take 81 mg by mouth Once a day   . cholecalciferol, vitamin D3, 25 mcg (1,000 unit) Oral Tablet Take 1,000 Units by mouth Once a day   . clonazePAM (KLONOPIN) 0.5 mg Oral Tablet Take 0.5 mg by mouth Once a day   . conjugated estrogens (PREMARIN) 0.625 mg/gram Vaginal Cream Insert 0.5 g into the vagina Per instructions 3 times a week   . predniSONE (DELTASONE) 10 mg Oral Tablet Take 6 tabs day 1, 5 tabs day 2, 4 tabs day 3, 3 tabs day 4, 2 tabs day 5, 1 tab day 6.     Social History     Socioeconomic History   . Marital status: Widowed     Spouse name: Not on file   . Number of children: Not on file   . Years of education: Not on file   . Highest education level: Not on file   Occupational History   . Not on file   Tobacco Use   . Smoking status: Never   . Smokeless tobacco: Never   Substance and Sexual Activity   . Alcohol use: No   . Drug use: Not on file   . Sexual activity: Not on file   Other Topics Concern   . Not on file   Social History Narrative   . Not on file     Social Determinants of Health     Financial Resource Strain: Not on file    Food Insecurity: Not on file   Transportation Needs: Not on file   Physical Activity: Not on file   Stress: Not on file   Intimate Partner Violence: Not on file   Housing Stability: Not on file       Physical Exam:   There were no vitals taken for this visit.      TELEMEDICINE DOCUMENTATION:    Patient Location:  Telephone visit from home address: [address]  [place] [address]    Patient/family aware of provider location:  yes  Patient/family consent for telemedicine:  yes  Examination observed and performed by:  Dineen Kid, DO  I spent  5 minutes on the phone with this patient.   ASSESSMENT/PLAN:    ICD-10-CM    1. Sore throat  J02.9 predniSONE (DELTASONE) 10 mg Oral Tablet      2. Nasal congestion  R09.81 predniSONE (DELTASONE) 10 mg Oral Tablet        Orders Placed This Encounter   . predniSONE (DELTASONE) 10 mg Oral Tablet        Drink plenty of water and use hot liquid prn and tylenol for any sore throat. We advise the patient to call back directly if there are further questions, or if these symptoms fail to improve as anticipated or worsen.            Vena Austria, DO

## 2021-09-06 NOTE — Progress Notes (Signed)
I personally offered the service to the patient, and obtained verbal consent to provide this service.    Caitlyn Ayoob, MA

## 2021-11-12 ENCOUNTER — Ambulatory Visit (INDEPENDENT_AMBULATORY_CARE_PROVIDER_SITE_OTHER): Payer: Medicare Other | Admitting: Family Medicine

## 2021-11-12 ENCOUNTER — Other Ambulatory Visit: Payer: Self-pay

## 2021-11-12 ENCOUNTER — Encounter (INDEPENDENT_AMBULATORY_CARE_PROVIDER_SITE_OTHER): Payer: Self-pay | Admitting: Family Medicine

## 2021-11-12 VITALS — BP 141/80 | HR 75 | Temp 96.7°F | Resp 18 | Ht 66.0 in | Wt 111.2 lb

## 2021-11-12 DIAGNOSIS — Z681 Body mass index (BMI) 19 or less, adult: Secondary | ICD-10-CM

## 2021-11-12 DIAGNOSIS — E46 Unspecified protein-calorie malnutrition: Secondary | ICD-10-CM | POA: Insufficient documentation

## 2021-11-12 DIAGNOSIS — K649 Unspecified hemorrhoids: Secondary | ICD-10-CM

## 2021-11-12 DIAGNOSIS — K5900 Constipation, unspecified: Secondary | ICD-10-CM

## 2021-11-12 MED ORDER — HYDROCORTISONE-PRAMOXINE 2.5 %-1 % TOPICAL CREAM
TOPICAL_CREAM | Freq: Three times a day (TID) | CUTANEOUS | 1 refills | Status: DC
Start: 2021-11-12 — End: 2022-07-28

## 2021-11-12 NOTE — Nursing Note (Signed)
11/12/21 1424   Adult Vitals   Initials knj   BP (Non-Invasive) (!) 141/80   Temperature 35.9 C (96.7 F)   Temp src Oral   Heart Rate 75   Respiratory Rate 18   SpO2 99 %   Body Measurements   Height 1.676 m (5\' 6" )   Weight 50.4 kg (111 lb 3.2 oz)   BMI (Calculated) 17.99

## 2021-11-12 NOTE — Progress Notes (Signed)
Name: Courtney [name]   Date of Birth: 1941/05/10  Date of Service: 11/12/2021  MRN: [id number]    Chief Complaint:   Chief Complaint   Patient presents with   . Hemorrhoids     Patient reports she has had hemorrhoids for a while but they have gotten worse in the last couple weeks, increased bleeding and pain       Subjective: Courtney [name] is a 81 y.o. female presenting for hemorrhoids with occasional bleeding.  Patient states she has had some hemorrhoids for few weeks and over the last week or so they have been bleeding.  She states she only has blood when she wipes.  She denies that she is constipated but states she has been having bowel movement where she does not feel like she is completely emptying herself.  She does admit she does not drink a lot of fluids during the day.  She has not tried anything over-the-counter for constipation.  She has tried over-the-counter hemorrhoid cream which has not helped with her symptoms and bleeding.    Past Medical History:   Diagnosis Date   . Past heart attack     take simvastatin    . Tremor         Past Surgical History:   Procedure Laterality Date   . HX CATARACT REMOVAL     . TRIGGER FINGER RELEASE            Allergies   Allergen Reactions   . Epinephrine      Nervous/jittery   . Cefdinir Nausea/ Vomiting    Current Outpatient Medications   Medication Sig   . cholecalciferol, vitamin D3, 25 mcg (1,000 unit) Oral Tablet Take 1 Tablet (1,000 Units total) by mouth Once a day   . clonazePAM (KLONOPIN) 0.5 mg Oral Tablet Take 1 Tablet (0.5 mg total) by mouth Once a day   . conjugated estrogens (PREMARIN) 0.625 mg/gram Vaginal Cream Insert 0.5 g into the vagina Per instructions 3 times a week   . Hydrocortisone-Pramoxine 2.5-1 % Cream Apply topically Three times a day        BP (!) 141/80   Pulse 75   Temp 35.9 C (96.7 F) (Oral)   Resp 18   Ht 1.676 m (5\' 6" )   Wt 50.4 kg (111 lb 3.2 oz)   SpO2 99%   BMI 17.95 kg/m           Review of  System:  Constitutional:within normal limits   HEENT: within normal limits  Respiratory: within normal limits  Cardiovascular: within normal limits  Gastrointestinal: within normal limits   Musculoskeletal: within normal limits   Neurological: within normal limits   Psych: normal mood and affect  All other ROS Negative    Objective:  Constitutional: well developed well nourished, in no acute distress  HEENT: Conjunctiva normal, external ears normal, no nasal drainage  Neck: Normal alignment, FROM, full strength, no instability  Cardiac: RRR, no murmurs, rubs or gallops  Pulmonary: Clear bilaterally  ABD: non distended, normal BS   Neuro: negative  Skin: normal, no sign of infection  Psych: normal mood and affect    COMPLETE BLOOD COUNT   Lab Results   Component Value Date    WBC 5.5 03/07/2021    HGB 12.3 03/07/2021    HCT 39.0 03/07/2021    PLTCNT 175 03/07/2021       DIFFERENTIAL  Lab Results   Component Value Date    PMNS 67 03/07/2021  MONOCYTES 7 03/07/2021    BASOPHILS 1 03/07/2021    BASOPHILS <0.10 03/07/2021    PMNABS 3.65 03/07/2021    LYMPHSABS 1.38 03/07/2021    EOSABS <0.10 03/07/2021    MONOSABS 0.38 03/07/2021     COMPREHENSIVE METABOLIC PANEL - NON FASTING  Lab Results   Component Value Date    SODIUM 141 03/07/2021    POTASSIUM 4.2 03/07/2021    CHLORIDE 107 03/07/2021    CO2 26 03/07/2021    ANIONGAP 8 03/07/2021    BUN 22 03/07/2021    CREATININE 0.84 03/07/2021    GLUCOSENF 81 03/07/2021    CALCIUM 9.4 03/07/2021    ALBUMIN 4.3 03/07/2021    TOTALPROTEIN 6.7 03/07/2021    ALKPHOS 48 03/07/2021    AST 22 03/07/2021    ALT 11 03/07/2021     Plan/Assessment:    ICD-10-CM    1. Hemorrhoids, unspecified hemorrhoid type  K64.9       2. Protein-calorie malnutrition, unspecified severity (CMS [place])  E46       3. Constipation, unspecified constipation type  K59.00       4. BMI less than 19,adult  Z68.1           I will send her in a different cream to use on her hemorrhoids.  Also instructed to use  MiraLax which she has at home to help with her incomplete emptying with bowel movement.  If she does not have improvement or worsen she was instructed call.  Patient voiced understanding was agreeable with plan.    Eulogio Ditch, DO  11/12/2021, 14:33

## 2022-03-10 ENCOUNTER — Encounter (INDEPENDENT_AMBULATORY_CARE_PROVIDER_SITE_OTHER): Payer: Self-pay | Admitting: Family Medicine

## 2022-03-13 ENCOUNTER — Encounter (INDEPENDENT_AMBULATORY_CARE_PROVIDER_SITE_OTHER): Payer: Medicare Other | Admitting: Family Medicine

## 2022-06-19 ENCOUNTER — Telehealth (INDEPENDENT_AMBULATORY_CARE_PROVIDER_SITE_OTHER): Payer: Self-pay | Admitting: Family Medicine

## 2022-06-19 NOTE — Telephone Encounter (Signed)
06/19/2022 Patient called requesting refills on Estrace 0.01%  insert 1 gram vaginally every evening.  She said medication was originally prescribed by previous PCP, Dr. Dianah Field.  [facility]

## 2022-06-24 MED ORDER — ESTRADIOL 0.01% (0.1 MG/GRAM) VAGINAL CREAM
2.0000 g | TOPICAL_CREAM | Freq: Every day | VAGINAL | 1 refills | Status: AC
Start: 2022-06-24 — End: ?

## 2022-06-26 ENCOUNTER — Encounter (INDEPENDENT_AMBULATORY_CARE_PROVIDER_SITE_OTHER): Payer: Self-pay | Admitting: Podiatrist

## 2022-06-26 ENCOUNTER — Ambulatory Visit: Payer: Medicare Other | Attending: Podiatrist | Admitting: Podiatrist

## 2022-06-26 VITALS — HR 79 | Temp 97.9°F | Resp 18

## 2022-06-26 DIAGNOSIS — M205X2 Other deformities of toe(s) (acquired), left foot: Secondary | ICD-10-CM | POA: Insufficient documentation

## 2022-06-26 DIAGNOSIS — M2041 Other hammer toe(s) (acquired), right foot: Secondary | ICD-10-CM | POA: Insufficient documentation

## 2022-06-26 DIAGNOSIS — M2011 Hallux valgus (acquired), right foot: Secondary | ICD-10-CM | POA: Insufficient documentation

## 2022-06-26 DIAGNOSIS — M79671 Pain in right foot: Secondary | ICD-10-CM | POA: Insufficient documentation

## 2022-06-26 DIAGNOSIS — M19071 Primary osteoarthritis, right ankle and foot: Secondary | ICD-10-CM | POA: Insufficient documentation

## 2022-06-26 DIAGNOSIS — M2012 Hallux valgus (acquired), left foot: Secondary | ICD-10-CM | POA: Insufficient documentation

## 2022-06-26 DIAGNOSIS — M2042 Other hammer toe(s) (acquired), left foot: Secondary | ICD-10-CM | POA: Insufficient documentation

## 2022-06-26 DIAGNOSIS — G609 Hereditary and idiopathic neuropathy, unspecified: Secondary | ICD-10-CM | POA: Insufficient documentation

## 2022-06-26 DIAGNOSIS — M19072 Primary osteoarthritis, left ankle and foot: Secondary | ICD-10-CM | POA: Insufficient documentation

## 2022-06-26 DIAGNOSIS — B351 Tinea unguium: Secondary | ICD-10-CM | POA: Insufficient documentation

## 2022-06-26 DIAGNOSIS — I872 Venous insufficiency (chronic) (peripheral): Secondary | ICD-10-CM | POA: Insufficient documentation

## 2022-06-26 NOTE — Progress Notes (Signed)
CC: Nail care with right 5th digit pain    HPI:  [name] with PMH indicated below presents today for nail care. Patient states she has intermittent pain in her right 5th digit. Rates this pain as 1/10 at this time. Patient believes this pain comes from how she walks and her mobility. She relates that this pain happens most often after being on her feet for a while but stretching does provide relief. Patient states that she now sees a neurologist for her neuropathy in her feet and legs. She states that the neuropathy causes a slight numb feeling to the bottom of her feet and occasionally out to her toes. Patient states she wears toe sleeves to prevent rubbing between her toes and this does help with her pain. Patient states that her toenails are elongated and thickened, and she is unable to trim them herself. Denies any other pedal complaints.     Courtney Ditch, DO   Last Visit: 11/12/2021    Past Medical History:   Diagnosis Date    Past heart attack     take simvastatin     Tremor      Current Outpatient Medications   Medication Sig    cholecalciferol, vitamin D3, 25 mcg (1,000 unit) Oral Tablet Take 1 Tablet (1,000 Units total) by mouth Once a day    clonazePAM (KLONOPIN) 0.5 mg Oral Tablet Take 1 Tablet (0.5 mg total) by mouth Once a day    estradioL (ESTRACE) 0.01 % (0.1 mg/gram) Vaginal Cream Insert 2 g into the vagina Once a day    Hydrocortisone-Pramoxine 2.5-1 % Cream Apply topically Three times a day       Allergies   Allergen Reactions    Epinephrine      Nervous/jittery    Cefdinir Nausea/ Vomiting     Past Surgical History:   Procedure Laterality Date    HX CATARACT REMOVAL      TRIGGER FINGER RELEASE       Family Medical History:       Problem Relation (Age of Onset)    Alzheimer's/Dementia Father    Heart Attack Brother, Paternal Grandfather    No Known Problems Sister, Maternal Grandmother, Maternal Grandfather    Ovarian Cancer Mother          Physical examination:   On General  Observation: Patient is a pleasant, cooperative, well developed 81 y.o. adult female. The patient is alert and oriented to time, place and person. Patient has normal affect and mood.    Vascular:  DP and PT pulses are palpable. CFT less than 3 seconds to all digits b/l.  Skin temperature is warm to warm from proximal to distal b/l.  Hair growth is absent. No edema noted. Significant varicosities noted.      Neuro:  Light touch intact b/l. Protective sensation diminished at 3/10 pedal sites via [employer] 5.07 monofilament b/l.  Proprioception intact at the hallux b/l.  No clonus noted. Babinski reflex not elicited b/l.    Derm:  Skin texture and turgor within normal limits.  Toenails 1-5 b/l are thickened, elongated, and discolored with the presence of subungual debris.  There is more significant thickening noted to the 2nd toenail on the right foot with some lateral deviation.  Webspaces 1-4 clean, dry, intact b/l. No rashes, subcutaneous nodules, or open lesions noted. There is not hyperkeratotic tissue noted sub metatarsal head right foot.    Musc:  Decreased medial longitudinal arch.  Muscle strength  is  5/5 for all muscle groups including dorsiflexors, plantarflexors, everters, and inverters b/l.  The 2-4 digit is contracted with a flexion contracture at the PIPJ as well as an extension type contracture at the metatarsophalangeal joint bilateral foot worse on the right than the left. There is no discomfort on palpation of the distal tips of digits 2 and 3 to the right foot today.  Adductovarus noted to 5th digit with rotation b/l, left > right. No discomfort upon palpation noted. No pain on palpation of the dorsal interphalangeal joint today.  There is not callus formation noted under the adjacent metatarsophalangeal joint.  The deformity is semi reducible. HAV deformity noted bilaterally. There is no pain over this area.  Ankle joint ROM is decreased b/l, STJ, and MTJ ROM full without pain or crepitus  b/l.      Impression: This is a 81 y.o. 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    Courtney [name] was seen today for nail problem.    Diagnoses and all orders for this visit:    Foot pain, right    Right foot pain    Hammer toe of right foot    Onychomycosis  -     11721 - DEBRIDEMENT OF NAIL BY ANY METHOD; 6 OR MORE (AMB ONLY)    Idiopathic neuropathy  -     11721 - DEBRIDEMENT OF NAIL BY ANY METHOD; 6 OR MORE (AMB ONLY)    Adductovarus rotation of toe, acquired, left    Hammer toes of both feet        Plan:   The patient was educated on clinical and radiographic findings, diagnosis and treatment plans. Patient state that she understands all that has been explained and all questions were answered to her apparent satisfaction.     - Patient was examined and evaluated.  - Etiology and tx options were discussed with patient to their understanding.     - I discussed with patient that she is to continue to wear her toe sleeves at all times to prevent irritation from her hammertoes. She is to make sure she is wearing wide enough shoes to accommodate for her adductovarus rotation.   - Follow up in 10 weeks     I am scribing for, and in the prescence of, Dr. Casimiro [name] for services provided on 06/26/2022  Courtney [name], SCRIBE    I personally performed the services described in this documentation, as scribed  in my presence, and it is both accurate  and complete.    Courtney [name], DPM    - Nails 1-5 bilateral were sharply debrided in length and thickness without incident.     Courtney [name], DPM

## 2022-06-26 NOTE — Nursing Note (Signed)
Patient is here for nail care. States that she is having pain in the left 5th toe. States that her pain is a 1/10. Voices no other complaints at this time.Gara Kroner, CNA

## 2022-07-28 ENCOUNTER — Encounter (HOSPITAL_BASED_OUTPATIENT_CLINIC_OR_DEPARTMENT_OTHER): Payer: Self-pay | Admitting: Legal Medicine

## 2022-07-28 ENCOUNTER — Ambulatory Visit: Payer: Medicare Other | Attending: Legal Medicine | Admitting: Legal Medicine

## 2022-07-28 ENCOUNTER — Ambulatory Visit (HOSPITAL_BASED_OUTPATIENT_CLINIC_OR_DEPARTMENT_OTHER): Payer: Medicare Other

## 2022-07-28 ENCOUNTER — Other Ambulatory Visit: Payer: Self-pay

## 2022-07-28 DIAGNOSIS — H35373 Puckering of macula, bilateral: Secondary | ICD-10-CM

## 2022-07-28 DIAGNOSIS — H5315 Visual distortions of shape and size: Secondary | ICD-10-CM | POA: Insufficient documentation

## 2022-07-28 NOTE — Progress Notes (Addendum)
[facility]  [place]  [place] [id number]  Operated by [facility]         Patient Name: Courtney [name]  MRN#: [id number]  Birthdate: 1941/03/14    Date of Service: 07/28/2022    Chief Complaint    Blurred Vision         Courtney [name] is a 81 y.o. female who presents today for evaluation/consultation of:  HPI       Blurred Vision    In left eye.  Vision is distorted.  Occurring intermittently.  It is worse at random times.  Context:  distance vision, mid-range vision and watching TV.             Comments    Return patient here for distortion in the left eye.  Pt states intermittently things look "squiggly". If watching TV, the closed captioning looks "squiggly" as well has road signs, but does not have that problem when reading. Pt states everything is fine with the right eye. Pt states sees neurologist for tremor in legs. Pt states went out on her porch one day and her white railing looked red. Neurologist told her could be mini-stroke and this has happened a couple times.           Last edited by Velora Mediate, OT on 07/28/2022 10:09 AM.        ROS    Positive for: Neurological (tremor in legs), Eyes  Last edited by Velora Mediate, OT on 07/28/2022 10:10 AM.         All other systems Negative    Velora Mediate, OT  07/28/2022, 10:14  Base Eye Exam       Visual Acuity (Snellen - Linear)         Right Left    Dist sc 20/30 20/50 -1    Dist ph sc  20/30 -2              Tonometry (Non-contact air puff, 10:13 AM)         Right Left    Pressure 14.8 11.2              Pupils         Dark Light Shape React APD    Right 3 3 Round Minimal None    Left 3 3 Round Minimal None              Visual Fields         Right Left     Full Full              Extraocular Movement         Right Left     Full, Ortho Full, Ortho              Neuro/Psych       Oriented x3: Yes    Mood/Affect: Normal                  Slit Lamp and Fundus Exam       External Exam         Right Left     External Normal Normal              Slit Lamp Exam         Right Left    Lids/Lashes Normal Normal    Conjunctiva/Sclera White and quiet White and quiet    Cornea Clear Clear    Anterior Chamber Deep and quiet Deep  and quiet    Iris Round and reactive Round and reactive    Lens Centered posterior chamber intraocular lens Centered posterior chamber intraocular lens    Vitreous Posterior vitreous detachment Posterior vitreous detachment              Fundus Exam         Right Left    Disc Peripapillary atrophy Peripapillary atrophy    C/D Ratio 0.75 0.75    Macula Normal Epiretinal membrane    Vessels Normal Normal    Periphery Normal Normal                    ICD-10-CM    1. 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 I discussed the above diagnosis listed in the assessment and the above ophthalmic plan of care with the patient and patient's family. The patient was given ample opportunity to ask questions and those questions were answered to the patient's satisfaction.  All questions were answered. The patient was encouraged to be involved in their own care, and all diagnoses, medications, and medication side-effects were discussed.  A good faith effort was made to reconcile the patient's medication.  The patient was told to contact me with any additional questions or concerns, or go to the ED, [place], in an emergency.  I reviewed and, when necessary, made changes to the technician note, documented ophthalmology exam, chief complaint, history of present illness, allergies, review of systems, past medical, past surgical, family and social history. I personally reviewed and interpreted all testing and/or imaging performed at this visit. Any exceptions/additions are edited/noted in the relevant encounter fields.    MD Addition to HPI:   Distortion OS for 2 months comes and goes no triggers     SS RD Precautions    MOCT   OD  erm  OS erm     MRI normal      History of ocular migraine      Glaucoma suspect    Chief Complaint    Blurred Vision         Courtney [name] is a 81 y.o. female who presents today for evaluation/consultation of:  HPI       Blurred Vision    In left eye.  Vision is distorted.  Occurring intermittently.  It is worse at random times.  Context:  distance vision, mid-range vision and watching TV.             Comments    Return patient here for distortion in the left eye.  Pt states intermittently things look "squiggly". If watching TV, the closed captioning looks "squiggly" as well has road signs, but does not have that problem when reading. Pt states everything is fine with the right eye. Pt states sees neurologist for tremor in legs. Pt states went out on her porch one day and her white railing looked red. Neurologist told her could be mini-stroke and this has happened a couple times.           Last edited by Ashok Pall, OT on 07/28/2022 10:09 AM.        ROS    Positive for: Neurological (tremor in legs), Eyes  Last edited by Ashok Pall, OT on 07/28/2022 10:10 AM.  I stressed the importance of FU visits to check IOP.  Noncompliance can lead to blindness.  Patient explained the ramifications of Glaucoma and to follow up with primary eye care provider for disease prevention with referring doctor (VF, Photo, OCT).        I have seen and examined the above patient. I discussed the above diagnoses listed in the assessment and the above ophthalmic plan of care with the patient and patient's family. All questions were answered. I reviewed and, when necessary, made changes to the technician note, documented ophthalmology exam, chief complaint, history of present illness, allergies, review of systems, past medical, past surgical, family and social history.    Ardelle Park, MD 07/28/2022, 10:38        ? Protein deficiency        FU in 2 months check ON OCT, VF      Ardelle Park, MD 07/28/2022  10:38

## 2022-08-15 ENCOUNTER — Encounter (INDEPENDENT_AMBULATORY_CARE_PROVIDER_SITE_OTHER): Payer: Self-pay | Admitting: Family Medicine

## 2022-08-15 ENCOUNTER — Ambulatory Visit (INDEPENDENT_AMBULATORY_CARE_PROVIDER_SITE_OTHER): Payer: Medicare Other | Admitting: Family Medicine

## 2022-08-15 ENCOUNTER — Other Ambulatory Visit: Payer: Self-pay

## 2022-08-15 VITALS — BP 139/85 | HR 74 | Temp 97.7°F | Resp 16 | Ht 66.0 in | Wt 113.4 lb

## 2022-08-15 DIAGNOSIS — Z681 Body mass index (BMI) 19 or less, adult: Secondary | ICD-10-CM

## 2022-08-15 DIAGNOSIS — M79642 Pain in left hand: Secondary | ICD-10-CM

## 2022-08-15 DIAGNOSIS — Z0000 Encounter for general adult medical examination without abnormal findings: Secondary | ICD-10-CM

## 2022-08-15 DIAGNOSIS — M65342 Trigger finger, left ring finger: Secondary | ICD-10-CM

## 2022-08-15 NOTE — Nursing Note (Signed)
08/15/22 1307   Adult Vitals   Initials tw   BP (Non-Invasive) 139/85   Temperature 36.5 C (97.7 F)   Temp src Oral   Heart Rate 74   Respiratory Rate 16   SpO2 96 %   Body Measurements   Height 1.676 m (5\' 6" )   Weight 51.4 kg (113 lb 6.4 oz)   BMI (Calculated) 18.34

## 2022-08-15 NOTE — Nursing Note (Signed)
08/15/22 1300   Medicare Wellness Assessment   Medicare initial or wellness physical in the last year? Yes   Advance Directives   Does patient have a living will or MPOA YES   Has patient provided [employer] with a copy? no   Advance directive information given to the patient today? no   Activities of Daily Living   Do you need help with dressing, bathing, or walking? No   Do you need help with shopping, housekeeping, medications, or finances? No   Do you have rugs in hallways, broken steps, or poor lighting? No   Do you have grab bars in your bathroom, non-slip strips in your tub, and hand rails on your stairs? Yes   Urinary Incontinence Screen   Do you ever leak urine when you don't want to? YES   Cognitive Function Screen   What is you age? 1   What is the time to the nearest hour? 1   What is the year? 1   What is the name of this clinic? 1   Can the patient recognize two persons (the doctor, the nurse, home help, etc.)? 1   What is the date of your birth? (day and month sufficient)  1   In what year did World War II end? 1   Who is the current president of the [place]? 1   Count from 20 down to 1? 1   What address did I give you earlier? 0   Total Score 9   Depression Screen   Little interest or pleasure in doing things. 0   Feeling down, depressed, or hopeless 0   PHQ 2 Total 0   Pain Score   Pain Score 1   Substance Use Screening   In Past 12 MONTHS, how often have you used any tobacco product (for example, cigarettes, e-cigarettes, cigars, pipes, or smokeless tobacco)? Never   In the PAST 12 MONTHS, how often have you had 5 (men)/4 (women) or more drinks containing alcohol in one day? Never   In the PAST 12 months, how often have you used any prescription medications just for the feeling, more than prescribed, or that were not prescribed for you? Prescriptions may include: opioids, benzodiazepines, medications for ADHD Never   In the PAST 12 MONTHS, how often have you used any drugs, including  marijuana, cocaine or crack, heroin, methamphetamine, hallucinogens, ecstasy/MDMA? Never   Hearing Screen   Have you noticed any hearing difficulties? No   After whispering 9-1-6 how many numbers did the patient repeat correctly? 3   Fall Risk Assessment   Do you feel unsteady when standing or walking? Yes   Do you worry about falling? Yes   Have you fallen in the past year? No   Vision Screen   Right Eye = 20 20   Left Eye = 20 40

## 2022-08-15 NOTE — Patient Instructions (Signed)
Medicare Preventive Services  Medicare coverage information Recommendation for YOU   Heart Disease and Diabetes   Lipid profile Every 5 years or more often if at risk for cardiovascular disease  ?   Diabetes Screening  yearly for those at risk for diabetes, 2 tests per year for those with prediabetes Last Glucose: 81    Diabetes Self Management Training or Medical Nutrition Therapy  For those with diabetes, up to 10 hrs initial training within a year, subsequent years up to 2 hrs of follow up training Optional for those with diabetes     Medical Nutrition Therapy Three hours of one-on-one counseling in first year, two hours in subsequent years Optional for those with diabetes, kidney disease   Intensive Behavioral Therapy for Obesity  Face-to-face counseling, first month every week, month 2-6 every other week, month 7-12 every month if continued progress is documented Optional for those with Body Mass Index 30 or higher  Your Body mass index is 18.3 kg/m.   Tobacco Cessation (Quitting) Counseling   Two attempts per year, max 4 sessions per attempt, up to 8 per year, for those with tobacco-related health condition Optional for those that use tobacco   Cancer Screening   Colorectal screening   For anyone age 74 to 48 or any age if high risk:  Screening Colonoscopy every 10 yrs if low risk,  more frequent if higher risk  OR  Cologuard Stool DNA test once every 3 years OR  Fecal Occult Blood Testing yearly OR  Flexible  Sigmoidoscopy  every 5 yr OR  CT Colonography every 5 yrs    See your schedule below   Screening Pap Test Recommended every 3 years for all women age 59 to 1, or every five years if combined with HPV test (routine screening not needed after total hysterectomy).  Medicare covers every 2 years, up to yearly if high risk.  Screening Pelvic Exam Medicare covers every 2 years, yearly if high risk or childbearing age with abnormal Pap in last 3 yrs. See your schedule below   Screening Mammogram   Recommended  every 2 years for women age 5 to 80, or more frequent if you have a higher risk. Selectively recommended for women between 40-49 based on shared decisions about risk. Covered by Medicare up to every year for women age 26 or older See your schedule below      Lung Cancer Screening  Annual low dose computed tomography (LDCT scan) is recommended for those age 81-77 who smoked 20 pack-years and are current smokers or quit smoking within past 15 years (one pack-year= smoking one PPD for one year), after counseling by your doctor or nurse clinician about the possible benefits or harms. See your schedule below   Vaccinations   Pneumococcal Vaccine: Recommended routinely age 63+ with one or two separate vaccines based on your risk    Recommended before age 83 if medical conditions with increased risk  Seasonal Influenza Vaccine: Once every flu season   Hepatitis B Vaccine: 3 doses if risk (including anyone with diabetes or liver disease)  Shingles Vaccine: Two doses at age 77 or older  Diphtheria Tetanus Pertussis Vaccine: ONCE as adult, booster every 10 years     Immunization History   Administered Date(s) Administered   . Covid-19 Vaccine,Pfizer Bivalent,28mcg/0.3ml,12 yrs+ 08/05/2021   . Covid-19 Vaccine,Pfizer-BioNTech,Purple Top,33yrs+ 07/04/2020   . Influenza Vaccine, 6 month-adult 08/25/2017   . PREVNAR 13 09/24/2017   . Shingrix - Zoster Vaccine 06/22/2017, 10/01/2017  Shingles vaccine and Diphtheria Tetanus Pertussis vaccines are available at pharmacies or local health department without a prescription.   Other Screening   Bone Densitometry   Every 24 months for anyone at risk, including postmenopausal       Glaucoma Screening   Yearly if in high risk group such as diabetes, family history, African American age 80+ or Hispanic American age 18+   See your Eye Care Provider   Hepatitis C Screening recommended ONCE for those born between 1945-1965, or high risk for HCV infection       HIV Testing recommended  routinely at least ONCE, covered every year for age 33 to 41 regardless of risk, and every year for age over 72 who ask for the test or higher risk  Yearly or up to 3 times in pregnancy         Abdominal Aortic Aneurysm Screening Ultrasound   Once between the age of 22-75 with a family history of AAA       Your Personalized Schedule for Preventive Tests   Health Maintenance: Pending and Last Completed       Date Due Completion Date    Osteoporosis screening Never done ---    Adult Tdap-Td (1 - Tdap) Never done ---    Pneumococcal Vaccination, Age 75+ (2 - PPSV23 or PCV20) 09/24/2018 09/24/2017    Influenza Vaccine (1) 05/30/2022 08/25/2017    Covid-19 Vaccine (3 - 2023-24 season) 05/30/2022 08/05/2021    Depression Screening 11/12/2022 11/12/2021    Medicare Annual Wellness Visit 08/16/2023 08/15/2022

## 2022-08-15 NOTE — Progress Notes (Signed)
Name: Courtney [name]   Date of Birth: June 27, 1941  Date of Service: 08/15/2022  MRN: [id number]    Chief Complaint:   Chief Complaint   Patient presents with    Hand Pain    Medicare Annual       Subjective: Courtney [name] is a 81 y.o. female presenting for left hand pain.  Patient states she is had the pain off and on for some time now.  She states the pain is mostly in the palm of her hand and she has difficulty closing her hand completely with her ring finger on her left hand.  She is left-handed.  She states she has a palpable knot that is tender in the palm of her left hand.  She also admits to history of a trigger thumb on the same hand in the past.  She has not tried anything over-the-counter to this point.    Past Medical History:   Diagnosis Date    Past heart attack     take simvastatin     Tremor         Past Surgical History:   Procedure Laterality Date    HX CATARACT REMOVAL      TRIGGER FINGER RELEASE            Allergies   Allergen Reactions    Epinephrine      Nervous/jittery    Cefdinir Nausea/ Vomiting    Current Outpatient Medications   Medication Sig    cholecalciferol, vitamin D3, 25 mcg (1,000 unit) Oral Tablet Take 1 Tablet (1,000 Units total) by mouth Once a day    clonazePAM (KLONOPIN) 0.5 mg Oral Tablet Take 1 Tablet (0.5 mg total) by mouth Once a day    estradioL (ESTRACE) 0.01 % (0.1 mg/gram) Vaginal Cream Insert 2 g into the vagina Once a day        BP 139/85   Pulse 74   Temp 36.5 C (97.7 F) (Oral)   Resp 16   Ht 1.676 m (5\' 6" )   Wt 51.4 kg (113 lb 6.4 oz)   SpO2 96%   BMI 18.30 kg/m           Review of System:  Constitutional:within normal limits   HEENT: within normal limits  Respiratory: within normal limits  Cardiovascular: within normal limits  Gastrointestinal: within normal limits   Musculoskeletal:  Left hand pain    Neurological: within normal limits   Psych: normal mood and affect  All other ROS Negative    Objective:  Constitutional: well developed well nourished,  in no acute distress  HEENT: Conjunctiva normal, external ears normal, no nasal drainage  Neck: Normal alignment, FROM, full strength, no instability  Cardiac: RRR, no murmurs, rubs or gallops  Pulmonary: Clear bilaterally  MS:  Limited flexion of her left ring finger with palpable knot on her tendon she of her left ring finger concerning for trigger finger  Neuro: negative  Skin: normal, no sign of infection  Psych: normal mood and affect    COMPLETE BLOOD COUNT   Lab Results   Component Value Date    WBC 5.5 03/07/2021    HGB 12.3 03/07/2021    HCT 39.0 03/07/2021    PLTCNT 175 03/07/2021       DIFFERENTIAL  Lab Results   Component Value Date    PMNS 67 03/07/2021    MONOCYTES 7 03/07/2021    BASOPHILS 1 03/07/2021    BASOPHILS <0.10 03/07/2021    PMNABS 3.65  03/07/2021    LYMPHSABS 1.38 03/07/2021    EOSABS <0.10 03/07/2021    MONOSABS 0.38 03/07/2021     COMPREHENSIVE METABOLIC PANEL   Lab Results   Component Value Date    SODIUM 141 03/07/2021    POTASSIUM 4.2 03/07/2021    CHLORIDE 107 03/07/2021    CO2 26 03/07/2021    ANIONGAP 8 03/07/2021    BUN 22 03/07/2021    CREATININE 0.84 03/07/2021    GLUCOSENF 81 03/07/2021    CALCIUM 9.4 03/07/2021    ALBUMIN 4.3 03/07/2021    TOTALPROTEIN 6.7 03/07/2021    ALKPHOS 48 03/07/2021    AST 22 03/07/2021    ALT 11 03/07/2021         LIPID PROFILE  No results found for: "CHOLESTEROL", "HDLCHOL", "LDLCHOL", "LDLCHOLDIR", "TRIG"    PROSTATE SPECIFIC ANTIGEN  No results found for: "PROSSPECAG"    Plan/Assessment:    ICD-10-CM    1. Trigger ring finger of left hand  M65.342       2. Left hand pain  M79.642       3. Medicare annual wellness visit, subsequent  Z00.00       4. BMI less than 19,adult  Z68.1           She was instructed to take ibuprofen over-the-counter and if she does not have improvement in her trigger finger on her left ring finger in 2 weeks to let me know and I would refer to the hand specialist.  She voiced understanding was agreeable with the plan.  See  Medicare wellness note below.    Rick Duff, DO  08/15/2022, 13:22      [facility]  [address]  [place] [address]    Medicare Annual Wellness Visit    Name: Courtney [name] MRN:  [id number]   Date: 08/15/2022 Age: 81 y.o.       SUBJECTIVE:   Courtney [name] is a 81 y.o. female for presenting for Medicare Wellness exam.   I have reviewed and reconciled the medication list with the patient today.    Comprehensive Health Assessment:  Paper document COMPREHENSIVE HEALTH ASSESSMENT reviewed and scanned into medical record    I have reviewed and updated as appropriate the past medical, family and social history. 08/15/2022 as summarized below:  Past Medical History:   Diagnosis Date    Past heart attack     take simvastatin     Tremor      Past Surgical History:   Procedure Laterality Date    Hx cataract removal      Trigger finger release       Current Outpatient Medications   Medication Sig    cholecalciferol, vitamin D3, 25 mcg (1,000 unit) Oral Tablet Take 1 Tablet (1,000 Units total) by mouth Once a day    clonazePAM (KLONOPIN) 0.5 mg Oral Tablet Take 1 Tablet (0.5 mg total) by mouth Once a day    estradioL (ESTRACE) 0.01 % (0.1 mg/gram) Vaginal Cream Insert 2 g into the vagina Once a day     Family Medical History:       Problem Relation (Age of Onset)    Alzheimer's/Dementia Father    Heart Attack Brother, Paternal Grandfather    No Known Problems Sister, Maternal Grandmother, Maternal Grandfather    Ovarian Cancer Mother            Social History     Socioeconomic History  Marital status: Widowed   Tobacco Use    Smoking status: Never    Smokeless tobacco: Never   Substance and Sexual Activity    Alcohol use: No         List of Current Health Care Providers   Care Team       PCP       Name Type Specialty Phone Number    Rick Duff, DO Physician [facility] [phone]              Care Team       No care team found                      Health  Maintenance   Topic Date Due    Osteoporosis screening  Never done    Adult Tdap-Td (1 - Tdap) Never done    Pneumococcal Vaccination, Age 86+ (2 - PPSV23 or PCV20) 09/24/2018    Influenza Vaccine (1) 05/30/2022    Covid-19 Vaccine (3 - 2023-24 season) 05/30/2022    Depression Screening  11/12/2022    Medicare Annual Wellness Visit  08/16/2023    Shingles Vaccine  Completed    Meningococcal Vaccine  Aged Out     Medicare Wellness Assessment   Medicare initial or wellness physical in the last year?: Yes  Advance Directives   Does patient have a living will or MPOA: YES   Has patient provided [employer] with a copy?: no   Advance directive information given to the patient today?: no      Activities of Daily Living   Do you need help with dressing, bathing, or walking?: No   Do you need help with shopping, housekeeping, medications, or finances?: No   Do you have rugs in hallways, broken steps, or poor lighting?: No   Do you have grab bars in your bathroom, non-slip strips in your tub, and hand rails on your stairs?: Yes   Urinary Incontinence Screen   Do you ever leak urine when you don't want to?: YES   Cognitive Function Screen (1=Yes, 0=No)   What is you age?: Correct   What is the time to the nearest hour?: Correct   What is the year?: Correct   What is the name of this clinic?: Correct   Can the patient recognize two persons (the doctor, the nurse, home help, etc.)?: Correct   What is the date of your birth? (day and month sufficient) : Correct   In what year did World War II end?: Correct   Who is the current president of the [place]?: Correct   Count from 20 down to 1?: Correct   What address did I give you earlier?: Incorrect   Total Score: 9       Hearing Screen   Have you noticed any hearing difficulties?: No  After whispering 9-1-6 how many numbers did the patient repeat correctly?: 3   Fall Risk Screen   Do you feel unsteady when standing or walking?: Yes  Do you worry about falling?: Yes  Have  you fallen in the past year?: No   Vision Screen   Right Eye = 20: 20   Left Eye = 20: 40   Depression Screen     Little interest or pleasure in doing things.: Not at all  Feeling down, depressed, or hopeless: Not at all  PHQ 2 Total: 0     Pain Score   Pain Score:   1  Substance Use-Abuse Screening     Tobacco Use     In Past 12 MONTHS, how often have you used any tobacco product (for example, cigarettes, e-cigarettes, cigars, pipes, or smokeless tobacco)?: Never     Alcohol use     In the PAST 12 MONTHS, how often have you had 5 (men)/4 (women) or more drinks containing alcohol in one day?: Never     Prescription Drug Use     In the PAST 12 months, how often have you used any prescription medications just for the feeling, more than prescribed, or that were not prescribed for you? Prescriptions may include: opioids, benzodiazepines, medications for ADHD: Never           Illicit Drug Use   In the PAST 12 MONTHS, how often have you used any drugs, including marijuana, cocaine or crack, heroin, methamphetamine, hallucinogens, ecstasy/MDMA?: Never         OBJECTIVE:   BP 139/85   Pulse 74   Temp 36.5 C (97.7 F) (Oral)   Resp 16   Ht 1.676 m (5\' 6" )   Wt 51.4 kg (113 lb 6.4 oz)   SpO2 96%   BMI 18.30 kg/m        Other appropriate exam:    Health Maintenance Due   Topic Date Due    Osteoporosis screening  Never done    Adult Tdap-Td (1 - Tdap) Never done    Pneumococcal Vaccination, Age 84+ (2 - PPSV23 or PCV20) 09/24/2018    Influenza Vaccine (1) 05/30/2022    Covid-19 Vaccine (3 - 2023-24 season) 05/30/2022      ASSESSMENT & PLAN:   Assessment/Plan   1. Trigger ring finger of left hand    2. Left hand pain    3. Medicare annual wellness visit, subsequent    4. BMI less than 19,adult       Identified Risk Factors/ Recommended Actions     Fall Risk Follow up plan of care: Manage & monitor hypotension  The PHQ 2 Total: 0 depression screen is interpreted as negative.  Urinary Incontinence Plan of Care:  Lifestyle modifications        No orders of the defined types were placed in this encounter.         The patient has been educated about risk factors and recommended preventive care. Written Prevention Plan completed/ updated and given to patient (see After Visit Summary).    Return if symptoms worsen or fail to improve.    Rick Duffarey F Amair Shrout, DO

## 2022-08-27 ENCOUNTER — Encounter (INDEPENDENT_AMBULATORY_CARE_PROVIDER_SITE_OTHER): Payer: Self-pay | Admitting: Family Medicine

## 2022-09-01 ENCOUNTER — Telehealth (INDEPENDENT_AMBULATORY_CARE_PROVIDER_SITE_OTHER): Payer: Self-pay | Admitting: Family Medicine

## 2022-09-01 DIAGNOSIS — M79642 Pain in left hand: Secondary | ICD-10-CM

## 2022-09-01 DIAGNOSIS — M65342 Trigger finger, left ring finger: Secondary | ICD-10-CM

## 2022-09-01 NOTE — Telephone Encounter (Signed)
Pt calling back stating she is still having trigger finger symptoms on left hand and Ibuprofen is not helping. Pt states it's not worse, but she would like to go ahead and be referred to Dr. Horton Chin.     Pt requesting an afternoon appointment, if possible.   Pt number is [phone]    Verdon Cummins, MA

## 2022-09-18 ENCOUNTER — Ambulatory Visit: Payer: Medicare Other | Attending: Podiatrist | Admitting: Podiatrist

## 2022-09-18 ENCOUNTER — Encounter (INDEPENDENT_AMBULATORY_CARE_PROVIDER_SITE_OTHER): Payer: Self-pay | Admitting: Podiatrist

## 2022-09-18 ENCOUNTER — Other Ambulatory Visit: Payer: Self-pay

## 2022-09-18 VITALS — HR 73 | Temp 97.8°F | Resp 19

## 2022-09-18 DIAGNOSIS — M19072 Primary osteoarthritis, left ankle and foot: Secondary | ICD-10-CM | POA: Insufficient documentation

## 2022-09-18 DIAGNOSIS — M2011 Hallux valgus (acquired), right foot: Secondary | ICD-10-CM | POA: Insufficient documentation

## 2022-09-18 DIAGNOSIS — M79674 Pain in right toe(s): Secondary | ICD-10-CM | POA: Insufficient documentation

## 2022-09-18 DIAGNOSIS — M19071 Primary osteoarthritis, right ankle and foot: Secondary | ICD-10-CM | POA: Insufficient documentation

## 2022-09-18 DIAGNOSIS — B351 Tinea unguium: Secondary | ICD-10-CM | POA: Insufficient documentation

## 2022-09-18 DIAGNOSIS — M2012 Hallux valgus (acquired), left foot: Secondary | ICD-10-CM | POA: Insufficient documentation

## 2022-09-18 DIAGNOSIS — M79671 Pain in right foot: Secondary | ICD-10-CM | POA: Insufficient documentation

## 2022-09-18 DIAGNOSIS — G609 Hereditary and idiopathic neuropathy, unspecified: Secondary | ICD-10-CM | POA: Insufficient documentation

## 2022-09-18 DIAGNOSIS — M79675 Pain in left toe(s): Secondary | ICD-10-CM | POA: Insufficient documentation

## 2022-09-18 DIAGNOSIS — I872 Venous insufficiency (chronic) (peripheral): Secondary | ICD-10-CM | POA: Insufficient documentation

## 2022-09-18 NOTE — Nursing Note (Signed)
Patient presents today for nail care. Patient states no pain at this time. Micalah Cabezas, CNA

## 2022-09-18 NOTE — Progress Notes (Signed)
CC: Nail care with right 5th digit pain    HPI:  [name] with PMH indicated below presents today for fungal nail care. She denies pain today. Patient states that she gets occasional pain to the left 5th digit but this comes and goes. She continues to wear her toe sleeves and does well with these. Patient states that her toenails are elongated and thickened, and she is unable to trim them herself. Denies any other pedal complaints.     Rick Duff, DO   Last Visit: 08/15/2022    Past Medical History:   Diagnosis Date    Past heart attack     take simvastatin     Tremor      Current Outpatient Medications   Medication Sig    cholecalciferol, vitamin D3, 25 mcg (1,000 unit) Oral Tablet Take 1 Tablet (1,000 Units total) by mouth Once a day    clonazePAM (KLONOPIN) 0.5 mg Oral Tablet Take 1 Tablet (0.5 mg total) by mouth Once a day    estradioL (ESTRACE) 0.01 % (0.1 mg/gram) Vaginal Cream Insert 2 g into the vagina Once a day       Allergies   Allergen Reactions    Epinephrine      Nervous/jittery    Cefdinir Nausea/ Vomiting     Past Surgical History:   Procedure Laterality Date    HX CATARACT REMOVAL      TRIGGER FINGER RELEASE       Family Medical History:       Problem Relation (Age of Onset)    Alzheimer's/Dementia Father    Heart Attack Brother, Paternal Grandfather    No Known Problems Sister, Maternal Grandmother, Maternal Grandfather    Ovarian Cancer Mother          Physical examination:   On General Observation: Patient is a pleasant, cooperative, well developed 81 y.o. adult female. The patient is alert and oriented to time, place and person. Patient has normal affect and mood.    Vascular:  DP and PT pulses are palpable. CFT less than 3 seconds to all digits b/l.  Skin temperature is warm to warm from proximal to distal b/l.  Hair growth is absent. No edema noted. Significant varicosities noted.      Neuro:  Light touch intact b/l. Protective sensation diminished at 3/10 pedal sites via  [facility] 5.07 monofilament b/l.  Proprioception intact at the hallux b/l.  No clonus noted. Babinski reflex not elicited b/l.    Derm:  Skin texture and turgor within normal limits.  Toenails 1-5 b/l are thickened, elongated, and discolored with the presence of subungual debris.  Webspaces 1-4 clean, dry, intact b/l. No rashes, subcutaneous nodules, or open lesions noted. There is not hyperkeratotic tissue noted sub metatarsal head right foot.    Musc:  Decreased medial longitudinal arch.  Muscle strength is  5/5 for all muscle groups including dorsiflexors, plantarflexors, everters, and inverters b/l.  The 2-4 digit is contracted with a flexion contracture at the PIPJ as well as an extension type contracture at the metatarsophalangeal joint bilateral foot worse on the right than the left. There is no discomfort on palpation of the distal tips of digits 2 and 3 to the right foot today.  Adductovarus noted to 5th digit with rotation b/l, left > right. No discomfort upon palpation noted. No pain on palpation of the dorsal interphalangeal joint today.  There is not callus formation noted under the adjacent metatarsophalangeal joint.  The deformity is semi reducible. HAV deformity noted bilaterally. There is no pain over this area.  Ankle joint ROM is decreased b/l, STJ, and MTJ ROM full without pain or crepitus b/l.      Impression: This is a 81 y.o. 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    Leonilda was seen today for nail problem.    Diagnoses and all orders for this visit:    Foot pain, right    Onychomycosis  -     11721 - DEBRIDEMENT OF NAIL BY ANY METHOD; 6 OR MORE (AMB ONLY)    Idiopathic neuropathy  -     11721 - DEBRIDEMENT OF NAIL BY ANY METHOD; 6 OR MORE (AMB ONLY)    Pain in toes of both feet  -     11721 - DEBRIDEMENT OF NAIL BY ANY METHOD; 6 OR MORE (AMB ONLY)      Plan:   The patient  was educated on clinical and radiographic findings, diagnosis and treatment plans. Patient state that she understands all that has been explained and all questions were answered to her apparent satisfaction.     - Patient was examined and evaluated.  - Etiology and tx options were discussed with patient to their understanding.     - Patient is to continue with toe spacers at all times and is to continue wearing shoes wide enough to accommodate her hammertoes.   - Follow up in 10 weeks     I am scribing for, and in the prescence of, Dr. Casimiro Needle for services provided on 09/18/2022  Algis Liming, SCRIBE    I personally performed the services described in this documentation, as scribed  in my presence, and it is both accurate  and complete.    Melina Schools, DPM    - Nails 1-5 bilateral were sharply debrided in length and thickness without incident.     Melina Schools, DPM

## 2022-09-30 ENCOUNTER — Ambulatory Visit (HOSPITAL_BASED_OUTPATIENT_CLINIC_OR_DEPARTMENT_OTHER): Payer: Medicare Other | Admitting: Legal Medicine

## 2022-09-30 DIAGNOSIS — H40003 Preglaucoma, unspecified, bilateral: Secondary | ICD-10-CM

## 2022-10-15 ENCOUNTER — Ambulatory Visit (HOSPITAL_BASED_OUTPATIENT_CLINIC_OR_DEPARTMENT_OTHER): Payer: Self-pay | Admitting: Physician Assistant

## 2022-11-25 ENCOUNTER — Ambulatory Visit: Payer: Medicare Other | Attending: Physician Assistant | Admitting: Physician Assistant

## 2022-11-25 ENCOUNTER — Other Ambulatory Visit: Payer: Self-pay

## 2022-11-25 DIAGNOSIS — M79642 Pain in left hand: Secondary | ICD-10-CM

## 2022-11-25 DIAGNOSIS — M65342 Trigger finger, left ring finger: Secondary | ICD-10-CM | POA: Insufficient documentation

## 2022-11-27 ENCOUNTER — Ambulatory Visit (INDEPENDENT_AMBULATORY_CARE_PROVIDER_SITE_OTHER): Payer: Self-pay | Admitting: Podiatrist

## 2022-12-12 NOTE — Progress Notes (Signed)
[facility]        NEW [name] OFFICE VISIT      [name] NAME:  Courtney [name] [name]  MEDICAL RECORD NUMBER: [id number]    TREATING PROVIDER: Jolinda Croak, PA-C  REFERRING PHYSICIAN:  Eulogio Ditch, DO    DOB:  1941/09/02  DOS:  11/25/2022      CHIEF COMPLAINT:   Chief Complaint   [name] presents with    Trigger Finger     Left ring            History of Present Illness:   Courtney [name] [name] is a 82 y.o. female who presents today for evaluation of her left ring trigger finger.Today she rates her pain as  1/10.  She describes it as intermittent, dull ache and made worse by pressure.  Courtney [name] [name] denies numbness and tingling. Courtney [name] [name] states that she uses rest to help alleviate her pain. She is left-hand dominant.Courtney [name] [name] denies tobacco use.Courtney [name] [name] is not diabetic.      Past Medical History:      Past Medical History:   Diagnosis Date    Past heart attack     take simvastatin     Tremor            Past Surgical History:   Procedure Laterality Date    HX CATARACT REMOVAL      TRIGGER FINGER RELEASE             Family Medical History:       Problem Relation (Age of Onset)    Alzheimer's/Dementia Father    Heart Attack Brother, Paternal Grandfather    No Known Problems Sister, Maternal Grandmother, Maternal Grandfather    Ovarian Cancer Mother              Social History     Socioeconomic History    Marital status: Widowed     Spouse name: Not on file    Number of children: Not on file    Years of education: Not on file    Highest education level: Not on file   Occupational History    Not on file   Tobacco Use    Smoking status: Never    Smokeless tobacco: Never   Substance and Sexual Activity    Alcohol use: No    Drug use: Not on file    Sexual activity: Not on file   Other Topics Concern    Not on file   Social History Narrative    Not on file     Social Determinants of Health     Financial Resource Strain: Not on file   Transportation Needs: Not on file   Social Connections: Not on file    Intimate Partner Violence: Not on file   Housing Stability: Not on file           Current Outpatient Medications   Medication Sig Dispense Refill    cholecalciferol, vitamin D3, 25 mcg (1,000 unit) Oral Tablet Take 1 Tablet (1,000 Units total) by mouth Once a day      clonazePAM (KLONOPIN) 0.5 mg Oral Tablet Take 1 Tablet (0.5 mg total) by mouth Once a day      estradioL (ESTRACE) 0.01 % (0.1 mg/gram) Vaginal Cream Insert 2 g into Courtney [name] vagina Once a day 42.5 g 1     No current facility-administered medications for this visit.       Allergies   Allergen Reactions    Epinephrine  Nervous/jittery    Cefdinir Nausea/ Vomiting           Review of Systems:      As per HPI otherwise Review of Systems is Negative.     Physical Examination:           GENERAL: [name] is in no acute distress. Awake, alert and oriented x 3. Mood is appropriate.  HEENT: Head is normocephalic, atraumatic. Extraocular movements are intact  NECK: Demonstrates functional range of motion. Trachea is midline.  CHEST: Demonstrates full expansion with inspiration.  HEART: Regular rate and rhythm.  ABDOMEN: Soft, Non-distended.  MUSCULOSKELETAL:  Left wrist and hand:  Skin is intact without lesion.  No signs of infection. No erythema drainage or increased warmth. No palpable masses. No lymphadenopathy.  There is no atrophy noted of Courtney [name] hand musculature.  Courtney [name] [name] has tenderness to palpation noted on Courtney [name] A1 pulley of Courtney [name] ring finger, nodule noted on Courtney [name] flexor tendon of Courtney [name] ring finger. Flexion is about 1/2 cm away from palm, full extension no subluxation. Radial pulse is 2 +.          Procedure:              After consent was obtained, proper site identified, and time out performed, [name] was given an injection to Courtney [name] left ring finger. 1/2 CC's of Lidocaine, 1/2 CC's of Celestone was placed in Courtney [name] flexor tendon sheath at Courtney [name] level of Courtney [name] A1 Pulley using sterile technique. Courtney [name] [name] tolerated Courtney [name] procedure well.      Data Reviewed:      None         Assessment:         Assessment/Plan   1. Trigger ring finger of left hand    2. Left hand pain         Plan:       I discussed my evaluation and treatment plan with Courtney [name] [name] .  At this time we did discuss Courtney [name] natural course of  trigger fingers.  I discuss Courtney [name] treatment plan would include conservative versus surgical intervention.She would like to treat conservatively at this time.[name] is agreeable to a trigger injection in her left ring finger today She will call is she wishes to have a injection into her left thumb. [name] seemed pleased and agreeable to this plan. All questions answered to Courtney [name] [name]'s satisfaction.    I am scribing for, and in Courtney [name] presence of, Courtney Croak, PA-C for services provided on 11/25/2022.  Courtney [name] [name], [place]         I personally performed Courtney [name] services described in this documentation, as scribed  in my presence, and it is both accurate  and complete.    Courtney Croak, PA-C    I have reviewed Courtney [name] note and made changes where needed.  I agree with Courtney [name] plan as above.    Courtney Spring, MD

## 2022-12-15 ENCOUNTER — Encounter (HOSPITAL_BASED_OUTPATIENT_CLINIC_OR_DEPARTMENT_OTHER): Payer: Self-pay | Admitting: Physician Assistant

## 2023-02-19 ENCOUNTER — Encounter (INDEPENDENT_AMBULATORY_CARE_PROVIDER_SITE_OTHER): Payer: Self-pay | Admitting: Podiatrist

## 2023-02-19 ENCOUNTER — Ambulatory Visit: Payer: Medicare Other | Attending: Podiatrist | Admitting: Podiatrist

## 2023-02-19 VITALS — HR 89 | Temp 97.7°F | Resp 19

## 2023-02-19 DIAGNOSIS — M79674 Pain in right toe(s): Secondary | ICD-10-CM | POA: Insufficient documentation

## 2023-02-19 DIAGNOSIS — M79675 Pain in left toe(s): Secondary | ICD-10-CM | POA: Insufficient documentation

## 2023-02-19 DIAGNOSIS — L84 Corns and callosities: Secondary | ICD-10-CM | POA: Insufficient documentation

## 2023-02-19 DIAGNOSIS — B351 Tinea unguium: Secondary | ICD-10-CM | POA: Insufficient documentation

## 2023-02-19 DIAGNOSIS — M19071 Primary osteoarthritis, right ankle and foot: Secondary | ICD-10-CM | POA: Insufficient documentation

## 2023-02-19 DIAGNOSIS — I872 Venous insufficiency (chronic) (peripheral): Secondary | ICD-10-CM | POA: Insufficient documentation

## 2023-02-19 DIAGNOSIS — G609 Hereditary and idiopathic neuropathy, unspecified: Secondary | ICD-10-CM | POA: Insufficient documentation

## 2023-02-19 DIAGNOSIS — M19072 Primary osteoarthritis, left ankle and foot: Secondary | ICD-10-CM | POA: Insufficient documentation

## 2023-02-19 DIAGNOSIS — M2011 Hallux valgus (acquired), right foot: Secondary | ICD-10-CM | POA: Insufficient documentation

## 2023-02-19 DIAGNOSIS — M2012 Hallux valgus (acquired), left foot: Secondary | ICD-10-CM | POA: Insufficient documentation

## 2023-02-19 NOTE — Nursing Note (Signed)
Patient presents today for nail care. Patient states no pain at this time.. Tykeria Wawrzyniak Goodwin, CNA

## 2023-02-19 NOTE — Progress Notes (Signed)
CC: Fungal toenails     HPI:  This 82 y.o. female with PMH indicated below presents today for fungal nail care. She denies pain today. Patient states that she gets occasional pain to the left 5th digit but this comes and goes. She endorses soreness to the callus on her left hallux and inquires how she can treat it. She also notes soreness to the right 3rd digit but is unsure of the cause. Patient states that her toenails are elongated and thickened, and she is unable to trim them herself. Denies any other pedal complaints.     Rick Duff, DO   Last Visit: 08/15/2022    Past Medical History:   Diagnosis Date    Past heart attack     take simvastatin     Tremor      Current Outpatient Medications   Medication Sig    cholecalciferol, vitamin D3, 25 mcg (1,000 unit) Oral Tablet Take 1 Tablet (1,000 Units total) by mouth Once a day    clonazePAM (KLONOPIN) 0.5 mg Oral Tablet Take 1 Tablet (0.5 mg total) by mouth Once a day    estradioL (ESTRACE) 0.01 % (0.1 mg/gram) Vaginal Cream Insert 2 g into the vagina Once a day     Allergies   Allergen Reactions    Epinephrine      Nervous/jittery    Cefdinir Nausea/ Vomiting     Past Surgical History:   Procedure Laterality Date    HX CATARACT REMOVAL      TRIGGER FINGER RELEASE       Family Medical History:       Problem Relation (Age of Onset)    Alzheimer's/Dementia Father    Heart Attack Brother, Paternal Grandfather    No Known Problems Sister, Maternal Grandmother, Maternal Grandfather    Ovarian Cancer Mother          Physical examination:   On General Observation: Patient is a pleasant, cooperative, well developed 82 y.o. adult female. The patient is alert and oriented to time, place and person. Patient has normal affect and mood.    Vascular:  DP and PT pulses are palpable. CFT less than 3 seconds to all digits b/l.  Skin temperature is warm to warm from proximal to distal b/l.  Hair growth is absent. No edema noted. Significant varicosities noted.      Neuro:  Light  touch intact b/l. Protective sensation diminished at 3/10 pedal sites via [facility] 5.07 monofilament b/l.  Proprioception intact at the hallux b/l.  No clonus noted. Babinski reflex not elicited b/l.    Derm:  Skin texture and turgor within normal limits.  Toenails 1-5 b/l are thickened, elongated, and discolored with the presence of subungual debris. Thel left 3rd nail is noted to be digging into the toe today. Webspaces 1-4 clean, dry, intact b/l. No rashes, subcutaneous nodules, or open lesions noted. There is hyperkeratotic tissue noted to the medial IPJ of the left hallux. Post debridement revealed no underlying sores. Decreased pain post debridement.     Musc:  Decreased medial longitudinal arch.  Muscle strength is  5/5 for all muscle groups including dorsiflexors, plantarflexors, everters, and inverters b/l.  The 2-4 digit is contracted with a flexion contracture at the PIPJ as well as an extension type contracture at the metatarsophalangeal joint bilateral foot worse on the right than the left. There is no discomfort on palpation of the distal tips of digits 2 and 3 to the right foot today.  Adductovarus noted to 5th digit with rotation b/l, left > right. No discomfort upon palpation noted. No pain on palpation of the dorsal interphalangeal joint today.  There is not callus formation noted under the adjacent metatarsophalangeal joint.  The deformity is semi reducible. HAV deformity noted bilaterally. There is no pain over this area.  Ankle joint ROM is decreased b/l, STJ, and MTJ ROM full without pain or crepitus b/l.      Impression: This is a 82 y.o. 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    Chasitie was seen today for foot pain and nail problem.    Diagnoses and all orders for this visit:    Pain in toes of both feet  -     11721 - DEBRIDEMENT OF NAIL BY ANY METHOD; 6 OR MORE (AMB  ONLY)    Onychomycosis  -     11721 - DEBRIDEMENT OF NAIL BY ANY METHOD; 6 OR MORE (AMB ONLY)    Idiopathic neuropathy  -     11721 - DEBRIDEMENT OF NAIL BY ANY METHOD; 6 OR MORE (AMB ONLY)        Plan:   The patient was educated on clinical and radiographic findings, diagnosis and treatment plans. Patient state that she understands all that has been explained and all questions were answered to her apparent satisfaction.     - Patient was examined and evaluated.  - Etiology and tx options were discussed with patient to their understanding.     - Patient is to continue with toe spacers at all times and is to continue wearing shoes wide enough to accommodate her hammertoes.   - Advised pt to obtain and start applying urea cream. Handout was dispensed to the pt. Patient was offered prescription but she would like to attempt OTC first.   - Follow up in 10 weeks     I am scribing for, and in the prescence of, Dr. Casimiro Needle for services provided on 02/19/2023  Algis Liming, SCRIBE    I personally performed the services described in this documentation, as scribed  in my presence, and it is both accurate  and complete.    Melina Schools, DPM    - Nails 1-5 bilateral were sharply debrided in length and thickness without incident.   - All hyperkeratotic tissue was sharply debrided to the patient's tolerance with a 15 blade.      Melina Schools, DPM

## 2023-04-21 ENCOUNTER — Ambulatory Visit: Payer: Medicare Other | Attending: Physician Assistant | Admitting: Physician Assistant

## 2023-04-21 ENCOUNTER — Encounter (HOSPITAL_BASED_OUTPATIENT_CLINIC_OR_DEPARTMENT_OTHER): Payer: Self-pay | Admitting: Physician Assistant

## 2023-04-21 ENCOUNTER — Other Ambulatory Visit: Payer: Self-pay

## 2023-04-21 VITALS — Ht 66.0 in | Wt 113.0 lb

## 2023-04-21 DIAGNOSIS — M65342 Trigger finger, left ring finger: Secondary | ICD-10-CM

## 2023-04-21 NOTE — Progress Notes (Signed)
Orthopaedics & Sports Medicine        RETURN PATIENT OFFICE VISIT      PATIENT NAME:  Courtney [name]  MEDICAL RECORD NUMBER: [id number]    TREATING PROVIDER: Kallie Edward, PA-C  REFERRING PHYSICIAN:  Rick Duff, DO    DOB:  [birth year]  DOS:  04/21/2023      CHIEF COMPLAINT:   Chief Complaint   Patient presents with    Trigger Finger     Left ring           History of Present Illness:   Al Noury is a 82 y.o. female who presents today for a follow up evaluation of her left ring trigger finger. The patient does not recall any acute injury or trauma.  Today she rates her pain as intermittent 2/10.  The patient describes her pain as dull and it is made worse by use or certain activities.  The patient states that she uses nothing to help alleviate her pain. The patient denies numbness and tingling. The patient states that she has not utilized any bracing. The patient is left-hand dominant. The patient denies tobacco use.The patient is not diabetic. She had a previous injection on 11/25/22 which did provide relief.     Past Medical History:      Past Medical History:   Diagnosis Date    Past heart attack     take simvastatin     Tremor            Past Surgical History:   Procedure Laterality Date    HX CATARACT REMOVAL      TRIGGER FINGER RELEASE             Family Medical History:       Problem Relation (Age of Onset)    Alzheimer's/Dementia Father    Heart Attack Brother, Paternal Grandfather    No Known Problems Sister, Maternal Grandmother, Maternal Grandfather    Ovarian Cancer Mother              Social History     Socioeconomic History    Marital status: Widowed     Spouse name: Not on file    Number of children: Not on file    Years of education: Not on file    Highest education level: Not on file   Occupational History    Not on file   Tobacco Use    Smoking status: Never    Smokeless tobacco: Never   Substance and Sexual Activity    Alcohol use: No    Drug use: Not on file    Sexual activity: Not  on file   Other Topics Concern    Not on file   Social History Narrative    Not on file     Social Determinants of Health     Financial Resource Strain: Not on file   Transportation Needs: Not on file   Social Connections: Not on file   Intimate Partner Violence: Not on file   Housing Stability: Not on file           Current Outpatient Medications   Medication Sig Dispense Refill    cholecalciferol, vitamin D3, 25 mcg (1,000 unit) Oral Tablet Take 1 Tablet (1,000 Units total) by mouth Once a day      clonazePAM (KLONOPIN) 0.5 mg Oral Tablet Take 1 Tablet (0.5 mg total) by mouth Once a day      estradioL (ESTRACE) 0.01 % (0.1 mg/gram) Vaginal Cream  Insert 2 g into the vagina Once a day 42.5 g 1     No current facility-administered medications for this visit.       Allergies   Allergen Reactions    Epinephrine      Nervous/jittery    Cefdinir Nausea/ Vomiting           Review of Systems:      As per HPI otherwise Review of Systems is Negative.     Physical Examination:        GENERAL: Patient is in no acute distress. Awake, alert and oriented x 3. Mood is appropriate.  HEENT: Head is normocephalic, atraumatic. Extraocular movements are intact  NECK: Demonstrates functional range of motion. Trachea is midline.  CHEST: Demonstrates full expansion with inspiration.  HEART: Regular rate and rhythm.  ABDOMEN: Soft, Non-distended.  MUSCULOSKELETAL:    Left wrist and hand:  Skin is intact without lesion.  No signs of infection. No erythema drainage or increased warmth. No palpable masses. No lymphadenopathy.  There is no atrophy noted of the hand musculature.  The patient has tenderness to palpation noted on the A1 pulley of the ring finger, nodule noted on the flexor tendon of the ring finger. Flexion is about 1/2 cm away from palm, full extension no subluxation. Radial pulse is 2 +.        Procedure:     After consent was obtained, proper site identified, and time out performed, patient was given an injection to the left  ring finger. 1/2 CC's of Lidocaine, 1/2 CC's of Celestone was placed in the flexor tendon sheath at the level of the A1 Pulley using sterile technique. The patient tolerated the procedure well.      Data Reviewed:     None     Assessment:         Assessment/Plan   1. Trigger ring finger of left hand         Plan:       I discussed my evaluation and treatment plan with the patient.  At this time we did discuss the natural course of  trigger finger. I discussed the treatment plan would include a repeat injection versus surgical intervention with a trigger release. She would like to proceed with a repeat injection. She will call us in two weeks to let us know if her symptoms have improved.  Patient seemed pleased and agreeable to this plan. All questions answered to the patient's satisfaction.    I am scribing for, and in the presence of, Kallie Edward, PA-C for services provided on 04/21/2023.  [place], [place]         I personally performed the services described in this documentation, as scribed  in my presence, and it is both accurate  and complete.    Kallie Edward, PA-C    I have reviewed the note and made changes where needed.  I agree with the plan as above.    Thelma Barge, MD

## 2023-04-27 MED ORDER — LIDOCAINE HCL 10 MG/ML (1 %) INJECTION SOLUTION
0.5000 mL | Freq: Once | INTRAMUSCULAR | Status: AC
Start: 2023-04-27 — End: 2023-04-21
  Administered 2023-04-21: 5 mg via INTRAMUSCULAR

## 2023-04-27 MED ORDER — BETAMETHASONE ACETATE AND SODIUM PHOS 6 MG/ML SUSPENSION FOR INJECTION
3.0000 mg | INTRAMUSCULAR | Status: AC
Start: 2023-04-27 — End: 2023-04-21
  Administered 2023-04-21: 3 mg via INTRAMUSCULAR

## 2023-05-14 ENCOUNTER — Encounter (HOSPITAL_BASED_OUTPATIENT_CLINIC_OR_DEPARTMENT_OTHER): Payer: Self-pay | Admitting: Physician Assistant

## 2023-05-28 ENCOUNTER — Ambulatory Visit: Payer: Medicare Other | Attending: Podiatrist | Admitting: Podiatrist

## 2023-05-28 ENCOUNTER — Encounter (INDEPENDENT_AMBULATORY_CARE_PROVIDER_SITE_OTHER): Payer: Self-pay | Admitting: Podiatrist

## 2023-05-28 VITALS — HR 88 | Temp 98.8°F | Resp 19

## 2023-05-28 DIAGNOSIS — M19071 Primary osteoarthritis, right ankle and foot: Secondary | ICD-10-CM | POA: Insufficient documentation

## 2023-05-28 DIAGNOSIS — M79675 Pain in left toe(s): Secondary | ICD-10-CM | POA: Insufficient documentation

## 2023-05-28 DIAGNOSIS — M79674 Pain in right toe(s): Secondary | ICD-10-CM | POA: Insufficient documentation

## 2023-05-28 DIAGNOSIS — M19072 Primary osteoarthritis, left ankle and foot: Secondary | ICD-10-CM | POA: Insufficient documentation

## 2023-05-28 DIAGNOSIS — B351 Tinea unguium: Secondary | ICD-10-CM | POA: Insufficient documentation

## 2023-05-28 DIAGNOSIS — G609 Hereditary and idiopathic neuropathy, unspecified: Secondary | ICD-10-CM | POA: Insufficient documentation

## 2023-05-28 MED ORDER — DICLOFENAC 1 % TOPICAL GEL
Freq: Four times a day (QID) | CUTANEOUS | 1 refills | Status: AC
Start: 2023-05-28 — End: ?

## 2023-05-28 NOTE — Nursing Note (Signed)
Patient presents today for follow up fungal nails. Patient states she is having 3/10 pain in bilateral heels, great toe, and medial sides of feet. Patient voices no further concerns or complaints at this time. Tandy Gaw, LPN

## 2023-05-28 NOTE — Progress Notes (Signed)
CC: Fungal toenails     HPI:  This 82 y.o. female with PMH indicated below presents today for fungal nail care. Rates pain as 3/10 in bilateral heels, great toe, and medial sides of the feet. She states that her feet are "uncomfortable all over" and it will radiate to different areas. She obtained Urea and has been applying it, but inquires the proper way to apply it. Patient states that her toenails are elongated and thickened, and she is unable to trim them herself. She states that one of her great toenails recently fell off. Denies any other pedal complaints.     Rick Duff, DO   Last Visit: 08/15/2022    Past Medical History:   Diagnosis Date    Past heart attack     take simvastatin     Tremor      Current Outpatient Medications   Medication Sig    cholecalciferol, vitamin D3, 25 mcg (1,000 unit) Oral Tablet Take 1 Tablet (1,000 Units total) by mouth Once a day    clonazePAM (KLONOPIN) 0.5 mg Oral Tablet Take 1 Tablet (0.5 mg total) by mouth Once a day    estradioL (ESTRACE) 0.01 % (0.1 mg/gram) Vaginal Cream Insert 2 g into the vagina Once a day     Allergies   Allergen Reactions    Epinephrine      Nervous/jittery    Cefdinir Nausea/ Vomiting     Past Surgical History:   Procedure Laterality Date    HX CATARACT REMOVAL      TRIGGER FINGER RELEASE       Family Medical History:       Problem Relation (Age of Onset)    Alzheimer's/Dementia Father    Heart Attack Brother, Paternal Grandfather    No Known Problems Sister, Maternal Grandmother, Maternal Grandfather    Ovarian Cancer Mother          Physical examination:   On General Observation: Patient is a pleasant, cooperative, well developed 82 y.o. adult female. The patient is alert and oriented to time, place and person. Patient has normal affect and mood.    Vascular:  DP and PT pulses are palpable. CFT less than 3 seconds to all digits b/l.  Skin temperature is warm to warm from proximal to distal b/l.  Hair growth is absent. No edema noted.  Significant varicosities noted.      Neuro:  Light touch intact b/l. Protective sensation diminished at 3/10 pedal sites via [facility] 5.07 monofilament b/l.  Proprioception intact at the hallux b/l.  No clonus noted. Babinski reflex not elicited b/l.    Derm:  Skin texture and turgor within normal limits.  Toenails 1-5 b/l are thickened, elongated, and discolored with the presence of subungual debris. Thel left 3rd nail is noted to be digging into the toe today. Webspaces 1-4 clean, dry, intact b/l. No rashes, subcutaneous nodules, or open lesions noted.     Musc:  Decreased medial longitudinal arch.  Muscle strength is  5/5 for all muscle groups including dorsiflexors, plantarflexors, everters, and inverters b/l.  The 2-4 digit is contracted with a flexion contracture at the PIPJ as well as an extension type contracture at the metatarsophalangeal joint bilateral foot worse on the right than the left. There is no discomfort on palpation of the distal tips of digits 2 and 3 to the right foot today.  Adductovarus noted to 5th digit with rotation b/l, left > right. No discomfort upon palpation noted. No pain  on palpation of the dorsal interphalangeal joint today.  There is not callus formation noted under the adjacent metatarsophalangeal joint.  The deformity is semi reducible. HAV deformity noted bilaterally. There is no pain over this area.  Ankle joint ROM is decreased b/l, STJ, and MTJ ROM full without pain or crepitus b/l.  Discomfort with palpation of bilateral midfoot noted.     Impression: This is a 82 y.o. 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    Adiyah was seen today for onychomycosis.    Diagnoses and all orders for this visit:    Pain in toes of both feet  -     11721 - DEBRIDEMENT OF NAIL BY ANY METHOD; 6 OR MORE (AMB ONLY)    Onychomycosis  -     11721 - DEBRIDEMENT OF NAIL BY ANY  METHOD; 6 OR MORE (AMB ONLY)    Idiopathic neuropathy  -     11721 - DEBRIDEMENT OF NAIL BY ANY METHOD; 6 OR MORE (AMB ONLY)    Arthritis of both midfeet    Other orders  -     diclofenac sodium (VOLTAREN) 1 % Gel; Apply topically Four times a day      Plan:   The patient was educated on clinical and radiographic findings, diagnosis and treatment plans. Patient state that she understands all that has been explained and all questions were answered to her apparent satisfaction.     - Patient was examined and evaluated.  - Etiology and tx options were discussed with patient to their understanding.     - Patient is to continue with toe spacers at all times and is to continue wearing shoes wide enough to accommodate her hammertoes.  - I discussed with patient that she is getting some discomfort from her midfoot arthritis.    - Discussed the use of antiinflammatory medications. Dispensed Rx for Diclofenac gel to be applied as directed. Patient is to apply as prescribed.    - Follow up in 10 weeks     I am scribing for, and in the prescence of, Dr. Casimiro Needle for services provided on 05/28/2023  Algis Liming, SCRIBE    I personally performed the services described in this documentation, as scribed  in my presence, and it is both accurate  and complete.    Melina Schools, DPM    - Nails 1-5 bilateral were sharply debrided in length and thickness without incident.     Melina Schools, DPM

## 2023-08-06 ENCOUNTER — Ambulatory Visit: Payer: Medicare Other | Attending: Podiatrist | Admitting: Podiatrist

## 2023-08-06 ENCOUNTER — Encounter (INDEPENDENT_AMBULATORY_CARE_PROVIDER_SITE_OTHER): Payer: Self-pay | Admitting: Podiatrist

## 2023-08-06 VITALS — HR 79 | Temp 97.9°F | Resp 19

## 2023-08-06 DIAGNOSIS — M79674 Pain in right toe(s): Secondary | ICD-10-CM | POA: Insufficient documentation

## 2023-08-06 DIAGNOSIS — B351 Tinea unguium: Secondary | ICD-10-CM | POA: Insufficient documentation

## 2023-08-06 DIAGNOSIS — I872 Venous insufficiency (chronic) (peripheral): Secondary | ICD-10-CM | POA: Insufficient documentation

## 2023-08-06 DIAGNOSIS — M19072 Primary osteoarthritis, left ankle and foot: Secondary | ICD-10-CM | POA: Insufficient documentation

## 2023-08-06 DIAGNOSIS — M19071 Primary osteoarthritis, right ankle and foot: Secondary | ICD-10-CM | POA: Insufficient documentation

## 2023-08-06 DIAGNOSIS — G609 Hereditary and idiopathic neuropathy, unspecified: Secondary | ICD-10-CM | POA: Insufficient documentation

## 2023-08-06 DIAGNOSIS — M2011 Hallux valgus (acquired), right foot: Secondary | ICD-10-CM | POA: Insufficient documentation

## 2023-08-06 DIAGNOSIS — M79675 Pain in left toe(s): Secondary | ICD-10-CM | POA: Insufficient documentation

## 2023-08-06 DIAGNOSIS — M2012 Hallux valgus (acquired), left foot: Secondary | ICD-10-CM | POA: Insufficient documentation

## 2023-08-06 NOTE — Nursing Note (Signed)
Patient presents today for follow up nail care. Patient states she is not having any pain. Patient voices no further concerns or complaints at this time. Tandy Gaw, LPN

## 2023-08-06 NOTE — Progress Notes (Signed)
CC: Fungal toenails     HPI:  This 82 y.o. female with PMH indicated below presents today for fungal nail care. Denies pain at this time, but states that her great toe has been bothersome lately. She applies Urea-40 cream regularly and states that this helps with her great toe pain. Denies applying Diclofenac gel because the instructions said to speak with her doctor before starting it. She states that her bunion occasionally causes her pain but her toe sleeves help with this. Patient states that her toenails are elongated and thickened, and she is unable to trim them herself. Denies any other pedal complaints.     Rick Duff, DO   Last Visit: 08/15/2022    Past Medical History:   Diagnosis Date    Past heart attack     take simvastatin     Tremor      Current Outpatient Medications   Medication Sig    cholecalciferol, vitamin D3, 25 mcg (1,000 unit) Oral Tablet Take 1 Tablet (1,000 Units total) by mouth Once a day    clonazePAM (KLONOPIN) 0.5 mg Oral Tablet Take 1 Tablet (0.5 mg total) by mouth Once a day    diclofenac sodium (VOLTAREN) 1 % Gel Apply topically Four times a day    estradioL (ESTRACE) 0.01 % (0.1 mg/gram) Vaginal Cream Insert 2 g into the vagina Once a day     Allergies   Allergen Reactions    Epinephrine      Nervous/jittery    Cefdinir Nausea/ Vomiting     Past Surgical History:   Procedure Laterality Date    HX CATARACT REMOVAL      TRIGGER FINGER RELEASE       Family Medical History:       Problem Relation (Age of Onset)    Alzheimer's/Dementia Father    Heart Attack Brother, Paternal Grandfather    No Known Problems Sister, Maternal Grandmother, Maternal Grandfather    Ovarian Cancer Mother          Physical examination:   On General Observation: Patient is a pleasant, cooperative, well developed 82 y.o. adult female. The patient is alert and oriented to time, place and person. Patient has normal affect and mood.    Vascular:  DP and PT pulses are palpable. CFT less than 3 seconds to all  digits b/l.  Skin temperature is warm to warm from proximal to distal b/l.  Hair growth is absent. No edema noted. Significant varicosities noted.      Neuro:  Light touch intact b/l. Protective sensation diminished at 3/10 pedal sites via [facility] 5.07 monofilament b/l.  Proprioception intact at the hallux b/l.  No clonus noted. Babinski reflex not elicited b/l.    Derm:  Skin texture and turgor within normal limits.  Toenails 1-5 b/l are thickened, elongated, and discolored with the presence of subungual debris. Webspaces 1-4 clean, dry, intact b/l. No rashes, subcutaneous nodules, or open lesions noted.     Musc:  Decreased medial longitudinal arch.  Muscle strength is  5/5 for all muscle groups including dorsiflexors, plantarflexors, everters, and inverters b/l.  The 2-4 digit is contracted with a flexion contracture at the PIPJ as well as an extension type contracture at the metatarsophalangeal joint bilateral foot worse on the right than the left. There is no discomfort on palpation of the distal tips of digits 2 and 3 to the right foot today.  Adductovarus noted to 5th digit with rotation b/l, left > right. No  discomfort upon palpation noted. No pain on palpation of the dorsal interphalangeal joint today.  There is not callus formation noted under the adjacent metatarsophalangeal joint.  The deformity is semi reducible. HAV deformity noted bilaterally. There is no pain over this area.  Ankle joint ROM is decreased b/l, STJ, and MTJ ROM full without pain or crepitus b/l.  No discomfort with palpation of bilateral midfoot noted.     Impression: This is a 82 y.o. 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    There are no diagnoses linked to this encounter.    Plan:   The patient was educated on clinical and radiographic findings, diagnosis and treatment plans. Patient state that she understands  all that has been explained and all questions were answered to her apparent satisfaction.     - Patient was examined and evaluated.  - Etiology and tx options were discussed with patient to their understanding.     - Dispensed large toe sleeves for continued use.   - Patient is to continue with toe spacers at all times and is to continue wearing shoes wide enough to accommodate her hammertoes.  - Continue applying Urea-40 cream as needed.   - Discussed the use of Diclofenac gel. Patient may apply as prescribed.    - Follow up in 9 weeks     I am scribing for, and in the prescence of, Dr. Casimiro Needle for services provided on 08/06/2023  Algis Liming, SCRIBE    *tgscribe*      - Nails 1-5 bilateral were sharply debrided in length and thickness without incident.

## 2023-10-08 ENCOUNTER — Encounter (INDEPENDENT_AMBULATORY_CARE_PROVIDER_SITE_OTHER): Payer: Self-pay | Admitting: Family Medicine

## 2023-10-15 ENCOUNTER — Ambulatory Visit (INDEPENDENT_AMBULATORY_CARE_PROVIDER_SITE_OTHER): Payer: Self-pay | Admitting: Podiatrist

## 2023-11-03 ENCOUNTER — Other Ambulatory Visit: Payer: Medicare Other | Attending: Family Medicine | Admitting: Family Medicine

## 2023-11-03 ENCOUNTER — Encounter (INDEPENDENT_AMBULATORY_CARE_PROVIDER_SITE_OTHER): Payer: Self-pay | Admitting: Family Medicine

## 2023-11-03 ENCOUNTER — Other Ambulatory Visit: Payer: Self-pay

## 2023-11-03 ENCOUNTER — Ambulatory Visit (INDEPENDENT_AMBULATORY_CARE_PROVIDER_SITE_OTHER): Payer: Medicare Other | Admitting: Family Medicine

## 2023-11-03 VITALS — BP 118/74 | HR 92 | Temp 97.5°F | Resp 16 | Ht 66.0 in | Wt 115.0 lb

## 2023-11-03 DIAGNOSIS — R29898 Other symptoms and signs involving the musculoskeletal system: Secondary | ICD-10-CM | POA: Insufficient documentation

## 2023-11-03 DIAGNOSIS — Z0000 Encounter for general adult medical examination without abnormal findings: Secondary | ICD-10-CM

## 2023-11-03 DIAGNOSIS — R531 Weakness: Secondary | ICD-10-CM

## 2023-11-03 LAB — CBC WITH DIFF
BASOPHIL #: <0.1 10*3/uL (ref <=0.20)
BASOPHIL %: 0.7 %
EOSINOPHIL #: <0.1 10*3/uL (ref <=0.50)
EOSINOPHIL %: 0.3 %
HCT: 36.5 % (ref 34.8–46.0)
HGB: 11.9 g/dL (ref 11.5–16.0)
IMMATURE GRANULOCYTE #: <0.1 10*3/uL (ref <0.10)
IMMATURE GRANULOCYTE %: 0.2 % (ref 0.0–1.0)
LYMPHOCYTE #: 1.49 10*3/uL (ref 1.00–4.80)
LYMPHOCYTE %: 25.5 %
MCH: 29.4 pg (ref 26.0–32.0)
MCHC: 32.6 g/dL (ref 31.0–35.5)
MCV: 90.1 fL (ref 78.0–100.0)
MONOCYTE #: 0.46 10*3/uL (ref 0.20–1.10)
MONOCYTE %: 7.9 %
MPV: 10.8 fL (ref 8.7–12.5)
NEUTROPHIL #: 3.83 10*3/uL (ref 1.50–7.70)
NEUTROPHIL %: 65.4 %
PLATELETS: 164 10*3/uL (ref 150–400)
RBC: 4.05 10*6/uL (ref 3.85–5.22)
RDW-CV: 13.5 % (ref 11.5–15.5)
WBC: 5.9 10*3/uL (ref 3.7–11.0)

## 2023-11-03 LAB — COMPREHENSIVE METABOLIC PANEL, NON-FASTING
ALBUMIN: 3.9 g/dL (ref 3.4–4.8)
ALKALINE PHOSPHATASE: 44 U/L — ABNORMAL LOW (ref 55–145)
ALT (SGPT): 11 U/L (ref 8–22)
ANION GAP: 8 mmol/L (ref 4–13)
AST (SGOT): 23 U/L (ref 8–45)
BILIRUBIN TOTAL: 0.5 mg/dL (ref 0.3–1.3)
BUN/CREA RATIO: 27 — ABNORMAL HIGH (ref 6–22)
BUN: 20 mg/dL (ref 8–25)
CALCIUM: 9.1 mg/dL (ref 8.6–10.3)
CHLORIDE: 111 mmol/L (ref 96–111)
CO2 TOTAL: 23 mmol/L (ref 23–31)
CREATININE: 0.75 mg/dL (ref 0.60–1.05)
ESTIMATED GFR - FEMALE: 79 mL/min/BSA (ref >=60)
GLUCOSE: 78 mg/dL (ref 65–125)
POTASSIUM: 4.2 mmol/L (ref 3.5–5.1)
PROTEIN TOTAL: 6.3 g/dL (ref 5.6–7.6)
SODIUM: 142 mmol/L (ref 136–145)

## 2023-11-03 LAB — RHEUMATOID FACTOR, SERUM: RHEUMATOID FACTOR: <13 [IU]/mL (ref <=30)

## 2023-11-03 LAB — SEDIMENTATION RATE: ERYTHROCYTE SEDIMENTATION RATE (ESR): 7 mm/h (ref 0–20)

## 2023-11-03 LAB — C-REACTIVE PROTEIN(CRP),INFLAMMATION: CRP INFLAMMATION: 0.6 mg/L (ref <8.0)

## 2023-11-03 NOTE — Nursing Note (Signed)
Venipuncture was performed in pt's left ac using clean technique. Labs drawn, pressure bandage applied to stop minimal bleeding, and patient had no complaints.   Caleb Popp, LPN  06/03/6212, 08:65

## 2023-11-03 NOTE — Patient Instructions (Signed)
Medicare Preventive Services  Medicare coverage information Recommendation for YOU   Heart Disease and Diabetes   Lipid profile Every 5 years or more often if at risk for cardiovascular disease   No results found for: "CHOLESTEROL", "HDLCHOL", "LDLCHOL", "LDLCHOLDIR", "TRIG"      Diabetes Screening    Yearly for those at risk for diabetes, 2 tests per year for those with prediabetes Last Glucose: 81    Diabetes Self Management Training or Medical Nutrition Therapy  For those with diabetes, up to 10 hrs initial training within a year, subsequent years up to 2 hrs of follow up training Optional for those with diabetes     Medical Nutrition Therapy  Three hours of one-on-one counseling in first year, two hours in subsequent years Optional for those with diabetes, kidney disease   Intensive Behavioral Therapy for Obesity  Face-to-face counseling, first month every week, month 2-6 every other week, month 7-12 every month if continued progress is documented Optional for those with Body Mass Index 30 or higher  Your Body mass index is 18.56 kg/m.   Tobacco Cessation (Quitting) Counseling   Covers up to 8 smoking and tobacco-use cessation counseling sessions in a 48-month period.    Optional for those that use tobacco   Cancer Screening Last Completion Date   Colorectal screening   For anyone age 40 to 66 or any age if high risk:  Screening Colonoscopy every 10 yrs if low risk,  more frequent if higher risk  OR  Cologuard Stool DNA test once every 3 years OR  Fecal Occult Blood Testing yearly OR  Flexible  Sigmoidoscopy  every 5 yr OR  CT Colonography every 5 yrs      See below for due date if applicable.   Screening Pap Test   Recommended every 3 years for all women age 6 to 33, or every five years if combined with HPV test (routine screening not needed after total hysterectomy).  Medicare covers every 2 years or yearly if high risk.  Screening Pelvic Exam   Medicare covers every 2 years, yearly if high risk or  childbearing age with abnormal Pap in last 3 yrs.     See below for due date if applicable.   Screening Mammogram   Recommended every 2 years for women age 46 to 70, or more frequent if you have a higher risk. Selectively recommended for women between 40-49 based on shared decisions about risk. Covered by Medicare up to every year for women age 63 or older   See below for due date if applicable.         Lung Cancer Screening  Annual low dose computed tomography (LDCT scan) is recommended for those age 44-80 who smoked 20 pack-years and are current smokers or quit smoking within past 15 years, after counseling by your doctor or nurse clinician about the possible benefits or harms.     See below for due date if applicable.   Vaccinations   Respiratory syncytial virus (RSV)  Age 83 years or older: Based on shared clinical decision-making with your provider.  Pneumococcal Vaccine  Recommended routinely age 31+ with one or two separate vaccines based on your risk. Recommended before age 31 if medical conditions with increased risk  Seasonal Influenza Vaccine  Once every flu season   Hepatitis B Vaccine  3 doses if risk (including anyone with diabetes or liver disease)  Shingles Vaccine  Two doses at age 26 or older  Diphtheria Tetanus Pertussis  Vaccine  ONCE as adult, booster every 10 years     Immunization History   Administered Date(s) Administered   . Covid-19 Vaccine,Pfizer Bivalent,30mcg/0.3ml,12 yrs+ 08/05/2021   . Covid-19 Vaccine,Pfizer-BioNTech,Purple Top,6yrs+ 07/04/2020   . Influenza Vaccine, 6 month-adult 08/25/2017   . PREVNAR 13 09/24/2017   . Shingrix - Zoster Vaccine 06/22/2017, 10/01/2017     Shingles vaccine and Diphtheria Tetanus Pertussis vaccines are available at pharmacies or local health department without a prescription.   Other Preventative Screening  Last Completion Date   Bone Densitometry   Screening: All females ages 33 and older every 10 years if initial screening normal. Postmenopausal  women ages 61-64 need screening with one or more risk factor: previous fracture, parental hip fracture, current smoker, low body weight, excessive alcohol use, Rheumatoid Arthritis   For women with diagnosed Osteoporosis, follow up is recommended every 2 years or a frequency recommended by your provider.     --05/26/2018  See below for due date if applicable.     Glaucoma Screening   Yearly if in high risk group such as diabetes, family history, African American age 70+ or Hispanic American age 42+   See your eye care provider for screening.   Hepatitis C Screening   Recommended  for those born between ages 18-79 years.     See below for due date if applicable.     HIV Testing  Recommended routinely at least ONCE, covered every year for age 6 to 50 regardless of risk, and every year for age over 43 who ask for the test or higher risk. Yearly or up to 3 times in pregnancy         See below for due date if applicable.   Abdominal Aortic Aneurysm Screening Ultrasound   Once with a family history of abdominal aortic aneurysms OR a female between65-75 and have smoked at least 100 cigarettes in your lifetime.         See below for due date if applicable.       Your Personalized Schedule for Preventive Tests   Health Maintenance: Pending and Last Completed       Date Due Completion Date    Adult Tdap-Td (1 - Tdap) Never done ---    RSV Adult 60+ or Pregnancy (1 - 1-dose 75+ series) Never done ---    Pneumococcal Vaccination, Age 85+ (2 of 2 - PPSV23) 09/24/2018 09/24/2017    Osteoporosis screening 05/26/2020 05/26/2018    Influenza Vaccine (1) 05/31/2023 08/25/2017    Covid-19 Vaccine (3 - 2024-25 season) 05/31/2023 08/05/2021    Depression Screening 11/02/2024 11/03/2023    Medicare Annual Wellness Visit 11/02/2024 11/03/2023                For Information on Advanced Directives for Health Care:  [place]:  [email or web address]  [place], [place], [place], [place] General Information:  [email or web address]

## 2023-11-03 NOTE — Nursing Note (Signed)
11/03/23 1341   Medicare Wellness Assessment   Medicare initial or wellness physical in the last year? Yes   Advance Directives   Does patient have a living will or MPOA Yes   Has patient provided [employer] with a copy? Yes   Activities of Daily Living   Do you need help with dressing, bathing, or walking? No   Do you need help with shopping, housekeeping, medications, or finances? Yes   Do you have rugs in hallways, broken steps, or poor lighting? No   Do you have grab bars in your bathroom, non-slip strips in your tub, and hand rails on your stairs? Yes   Cognitive Function Screen   What is you age? 1   What is the time to the nearest hour? 1   What is the year? 1   What is the name of this clinic? 1   Can the patient recognize two persons (the doctor, the nurse, home help, etc.)? 1   What is the date of your birth? (day and month sufficient)  1   In what year did World War II end? 1   Who is the current president of the [place]? 1   Count from 20 down to 1? 1   What address did I give you earlier? 0   Total Score 9   Depression Screen   Little interest or pleasure in doing things. 0   Feeling down, depressed, or hopeless 0   PHQ 2 Total 0   Pain Score   Pain Score Zero   Substance Use Screening   In Past 12 MONTHS, how often have you used any tobacco product (for example, cigarettes, e-cigarettes, cigars, pipes, or smokeless tobacco)? Never   In the PAST 12 MONTHS, how often have you had 5 (men)/4 (women) or more drinks containing alcohol in one day? Never   In the PAST 12 months, how often have you used any prescription medications just for the feeling, more than prescribed, or that were not prescribed for you? Prescriptions may include: opioids, benzodiazepines, medications for ADHD Never   In the PAST 12 MONTHS, how often have you used any drugs, including marijuana, cocaine or crack, heroin, methamphetamine, hallucinogens, ecstasy/MDMA? Never   Fall Risk Assessment   Do you feel unsteady when  standing or walking? Yes   Do you worry about falling? Yes   Have you fallen in the past year? No   Urinary Incontinence Screen   Do you ever leak urine when you don't want to? YES

## 2023-11-03 NOTE — Progress Notes (Addendum)
 Name: Courtney [name]   Date of Birth: 12/14/1940  Date of Service: 11/19/2023  MRN: [id number]    Chief Complaint:   Chief Complaint   Patient presents with    ED Follow-up    Medicare Annual       Subjective: Courtney [name] is a 83 y.o. female presenting for ER follow-up.  Patient presented to the emergency department in December for lower extremity weakness.  Her ER records were reviewed.  She did have lab work but no imaging performed.  Her discharge they did give her course of steroids when she states did help with her lower extremity weakness.  She is able to ambulate with a walker and before hand was able to ambulate without.  She does have difficulty with stairs.  She denies any weakness of her upper extremities and states she can use them normally.  Denies any back pain or changes in bowel habits.  She does continue to have weakness that has not significantly improved.  She would be willing to do more workup for weakness.  She also follows with neurology for history of a tremor.  She has an appointment with them later this month.    Past Medical History:   Diagnosis Date    Past heart attack     take simvastatin     Tremor         Past Surgical History:   Procedure Laterality Date    HX CATARACT REMOVAL      TRIGGER FINGER RELEASE            Allergies   Allergen Reactions    Epinephrine      Nervous/jittery    Cefdinir Nausea/ Vomiting    Current Outpatient Medications   Medication Sig    cholecalciferol, vitamin D3, 25 mcg (1,000 unit) Oral Tablet Take 1 Tablet (1,000 Units total) by mouth Once a day    clonazePAM (KLONOPIN) 0.5 mg Oral Tablet Take 1 Tablet (0.5 mg total) by mouth Once a day    diclofenac sodium (VOLTAREN) 1 % Gel Apply topically Four times a day    estradioL (ESTRACE) 0.01 % (0.1 mg/gram) Vaginal Cream Insert 2 g into the vagina Once a day        BP 118/74   Pulse 92   Temp 36.4 C (97.5 F) (Oral)   Resp 16   Ht 1.676 m (5\' 6" )   Wt 52.2 kg (115 lb)   SpO2 96%   BMI 18.56 kg/m            Review of System:  Constitutional:within normal limits   HEENT: within normal limits  Respiratory: within normal limits  Cardiovascular: within normal limits  Gastrointestinal: within normal limits   Musculoskeletal:  Lower extremity weakness    Neurological: within normal limits   Psych: normal mood and affect  All other ROS Negative    Objective:  Constitutional: well developed well nourished, in no acute distress  HEENT: Conjunctiva normal, external ears normal, no nasal drainage  Neck: Normal alignment, FROM, full strength, no instability  Cardiac: RRR, no murmurs, rubs or gallops  Pulmonary: Clear bilaterally  ABD: non distended, normal BS   MS:  ambulating with Rollator, equal strength bilateral lower extremities  Neuro: negative  Skin: normal, no sign of infection  Psych: normal mood and affect    COMPLETE BLOOD COUNT   Lab Results   Component Value Date    WBC 5.9 11/03/2023    HGB 11.9 11/03/2023  HCT 36.5 11/03/2023    PLTCNT 164 11/03/2023       DIFFERENTIAL  Lab Results   Component Value Date    PMNS 65.4 11/03/2023    MONOCYTES 7.9 11/03/2023    BASOPHILS 0.7 11/03/2023    BASOPHILS <0.10 11/03/2023    PMNABS 3.83 11/03/2023    LYMPHSABS 1.49 11/03/2023    EOSABS <0.10 11/03/2023    MONOSABS 0.46 11/03/2023     COMPREHENSIVE METABOLIC PANEL  Lab Results   Component Value Date    SODIUM 142 11/03/2023    POTASSIUM 4.2 11/03/2023    CHLORIDE 111 11/03/2023    CO2 23 11/03/2023    ANIONGAP 8 11/03/2023    BUN 20 11/03/2023    CREATININE 0.75 11/03/2023    GLUCOSENF 81 03/07/2021    GLUCOSE 78 11/03/2023    CALCIUM 9.1 11/03/2023    ALBUMIN 3.9 11/03/2023    TOTALPROTEIN 6.3 11/03/2023    ALKPHOS 44 (L) 11/03/2023    AST 23 11/03/2023    ALT 11 11/03/2023    GFR 79 11/03/2023             Plan/Assessment:    ICD-10-CM    1. Weakness of both lower extremities  R29.898 CBC/DIFF     COMPREHENSIVE METABOLIC PANEL, NON-FASTING     ESR     CRP, Inflammation     HEP-2 SUBSTRATE ANTINUCLEAR ANTIBODIES  (ANA), SERUM     RHEUMATOID FACTOR, SERUM     DME - WALKER Other (Specify in comments); Rollator; Furniture conservator/restorer: None      2. Medicare annual wellness visit, subsequent  Z00.00       3. Generalized weakness  R53.1 DME - WALKER Other (Specify in comments); Rollator; Furniture conservator/restorer: None          Due to her weakness in her lower extremities and will check a CBC, CMP, ESR, CRP, ANA, and RA.  Will base further plan on those lab results.  I do have some concern it may be polymyalgia rheumatica.  She was also instructed to discuss it with Neurology.  If all of her testing returns negative I may refer to physical therapy or home health physical therapy to see if we can help with her strength.  She is currently ambulating with a Rollator.  She is able to use a Rollator in her house and would benefit getting her own Rollator since she is currently bothering 1 from her neighbor.  She can not use a regular walker due to weakness of her upper extremities and would do better with a Rollator in her gait is too unsteady to ambulate with a cane.    Rick Duff, DO  11/03/2023, 13:49    [facility]  [address]  [place] [address]    Medicare Annual Wellness Visit    Name: Courtney [name] MRN:  [id number]   Date: 11/03/2023 Age: 84 y.o.       SUBJECTIVE:   [name] is a 83 y.o. female for presenting for Medicare Wellness exam.   I have reviewed and reconciled the medication list with the patient today.    Comprehensive Health Assessment:  Paper document COMPREHENSIVE HEALTH ASSESSMENT reviewed and scanned into medical record    I have reviewed and updated as appropriate the past medical, family and social history. 11/03/2023 as summarized below:  Past Medical History:   Diagnosis Date    Past heart attack  take simvastatin     Tremor      Past Surgical History:   Procedure Laterality Date    Hx cataract removal      Trigger finger release       Current Outpatient  Medications   Medication Sig    cholecalciferol, vitamin D3, 25 mcg (1,000 unit) Oral Tablet Take 1 Tablet (1,000 Units total) by mouth Once a day    clonazePAM (KLONOPIN) 0.5 mg Oral Tablet Take 1 Tablet (0.5 mg total) by mouth Once a day    diclofenac sodium (VOLTAREN) 1 % Gel Apply topically Four times a day    estradioL (ESTRACE) 0.01 % (0.1 mg/gram) Vaginal Cream Insert 2 g into the vagina Once a day     Family Medical History:       Problem Relation (Age of Onset)    Alzheimer's/Dementia Father    Heart Attack Brother, Paternal Grandfather    No Known Problems Sister, Maternal Grandmother, Maternal Grandfather    Ovarian Cancer Mother            Social History     Socioeconomic History    Marital status: Widowed   Tobacco Use    Smoking status: Never    Smokeless tobacco: Never   Substance and Sexual Activity    Alcohol use: No         List of Current Health Care Providers   Care Team       PCP       Name Type Specialty Phone Number    Rick Duff, DO Physician [facility] [phone]              Care Team       No care team found                      Health Maintenance   Topic Date Due    Adult Tdap-Td (1 - Tdap) Never done    RSV Adult 60+ or Pregnancy (1 - 1-dose 75+ series) Never done    Pneumococcal Vaccination, Age 23+ (2 of 2 - PPSV23) 09/24/2018    Osteoporosis screening  05/26/2020    Influenza Vaccine (1) 05/31/2023    Covid-19 Vaccine (3 - 2024-25 season) 05/31/2023    Depression Screening  11/02/2024    Medicare Annual Wellness Visit  11/02/2024    Shingles Vaccine  Completed     Medicare Wellness Assessment   Medicare initial or wellness physical in the last year?: Yes  Advance Directives   Does patient have a living will or MPOA: Yes   Has patient provided [employer] with a copy?: Yes              Activities of Daily Living   Do you need help with dressing, bathing, or walking?: No   Do you need help with shopping, housekeeping, medications, or finances?: Yes   Do you have rugs in  hallways, broken steps, or poor lighting?: No   Do you have grab bars in your bathroom, non-slip strips in your tub, and hand rails on your stairs?: Yes   Cognitive Function Screen (1=Yes, 0=No)   What is you age?: Correct   What is the time to the nearest hour?: Correct   What is the year?: Correct   What is the name of this clinic?: Correct   Can the patient recognize two persons (the doctor, the nurse, home help, etc.)?: Correct   What is the date of  your birth? (day and month sufficient) : Correct   In what year did World War II end?: Correct   Who is the current president of the [place]?: Correct   Count from 20 down to 1?: Correct   What address did I give you earlier?: Incorrect   Total Score: 9       Fall Risk Screen   Do you feel unsteady when standing or walking?: Yes  Do you worry about falling?: Yes  Have you fallen in the past year?: No   Depression Screen     Little interest or pleasure in doing things.: Not at all  Feeling down, depressed, or hopeless: Not at all  PHQ 2 Total: 0     Pain Score   Pain Score:   0 - No pain    Substance Use-Abuse Screening     Tobacco Use     In Past 12 MONTHS, how often have you used any tobacco product (for example, cigarettes, e-cigarettes, cigars, pipes, or smokeless tobacco)?: Never     Alcohol use     In the PAST 12 MONTHS, how often have you had 5 (men)/4 (women) or more drinks containing alcohol in one day?: Never     Prescription Drug Use     In the PAST 12 months, how often have you used any prescription medications just for the feeling, more than prescribed, or that were not prescribed for you? Prescriptions may include: opioids, benzodiazepines, medications for ADHD: Never           Illicit Drug Use   In the PAST 12 MONTHS, how often have you used any drugs, including marijuana, cocaine or crack, heroin, methamphetamine, hallucinogens, ecstasy/MDMA?: Never                                  OBJECTIVE:   BP 118/74   Pulse 92   Temp 36.4 C (97.5 F)  (Oral)   Resp 16   Ht 1.676 m (5\' 6" )   Wt 52.2 kg (115 lb)   SpO2 96%   BMI 18.56 kg/m        Other appropriate exam:    Health Maintenance Due   Topic Date Due    Adult Tdap-Td (1 - Tdap) Never done    RSV Adult 60+ or Pregnancy (1 - 1-dose 75+ series) Never done    Pneumococcal Vaccination, Age 76+ (2 of 2 - PPSV23) 09/24/2018    Osteoporosis screening  05/26/2020    Influenza Vaccine (1) 05/31/2023    Covid-19 Vaccine (3 - 2024-25 season) 05/31/2023      ASSESSMENT & PLAN:   Assessment/Plan   1. Weakness of both lower extremities    2. Medicare annual wellness visit, subsequent    3. Generalized weakness       Identified Risk Factors/ Recommended Actions     Fall Risk Follow up plan of care:  Workup for weakness and then physical therapy if negative  The PHQ 2 Total: 0 depression screen is interpreted as negative.    Urinary Incontinence Plan of Care: Lifestyle modifications          Orders Placed This Encounter    CBC/DIFF    COMPREHENSIVE METABOLIC PANEL, NON-FASTING    ESR    CRP, Inflammation    HEP-2 SUBSTRATE ANTINUCLEAR ANTIBODIES (ANA), SERUM    RHEUMATOID FACTOR, SERUM    CBC WITH DIFF    HM  MEDICARE ANNUAL WELLNESS VISIT    DME - WALKER Other (Specify in comments); Rollator; Furniture conservator/restorer: None          The patient has been educated about risk factors and recommended preventive care. Written Prevention Plan completed/ updated and given to patient (see After Visit Summary).    Return in about 1 year (around 11/02/2024).    Rick Duff, DO

## 2023-11-03 NOTE — Nursing Note (Signed)
11/03/23 1342   Adult Vitals   Initials tw   BP (Non-Invasive) (!) 150/89   Temperature 36.4 C (97.5 F)   Temp src Oral   Heart Rate 92   Respiratory Rate 16   SpO2 96 %   Body Measurements   Height 1.676 m (5\' 6" )   Weight 52.2 kg (115 lb)   BMI (Calculated) 18.6

## 2023-11-05 ENCOUNTER — Other Ambulatory Visit (INDEPENDENT_AMBULATORY_CARE_PROVIDER_SITE_OTHER): Payer: Self-pay | Admitting: Family Medicine

## 2023-11-05 DIAGNOSIS — R29898 Other symptoms and signs involving the musculoskeletal system: Secondary | ICD-10-CM

## 2023-11-05 LAB — HEP-2 SUBSTRATE ANTINUCLEAR ANTIBODIES (ANA), SERUM: ANA INTERPRETATION: NEGATIVE

## 2023-11-06 ENCOUNTER — Non-Acute Institutional Stay: Payer: Medicare Hospice

## 2023-11-06 ENCOUNTER — Other Ambulatory Visit: Payer: Self-pay

## 2023-11-06 NOTE — Nursing Note (Signed)
Case conference scheduled

## 2023-11-08 ENCOUNTER — Other Ambulatory Visit: Payer: Self-pay

## 2023-11-09 ENCOUNTER — Other Ambulatory Visit: Payer: Self-pay

## 2023-11-10 ENCOUNTER — Other Ambulatory Visit: Payer: Self-pay

## 2023-11-16 ENCOUNTER — Telehealth (INDEPENDENT_AMBULATORY_CARE_PROVIDER_SITE_OTHER): Payer: Self-pay | Admitting: Family Medicine

## 2023-11-16 ENCOUNTER — Ambulatory Visit: Payer: Self-pay

## 2023-11-16 NOTE — Telephone Encounter (Signed)
 11/16/2023 Patient called to let you know she has decided the best walker for her is a rollator walker.  She tried a neighbor's and it seemed like a good fit for her. She said the number was [id number] and she would like it from [facility]-fax # [phone].  She is using a borrowed walker which she discussed with you at her appointment on 11/03/2023.  Documentation needs to be in that note.  Gildardo Pounds

## 2023-11-19 NOTE — Addendum Note (Signed)
 Addended by: Carman Ching F on: 11/19/2023 08:30 AM     Modules accepted: Orders

## 2023-12-24 ENCOUNTER — Ambulatory Visit: Payer: Self-pay | Attending: Podiatrist | Admitting: Podiatrist

## 2023-12-24 ENCOUNTER — Encounter (INDEPENDENT_AMBULATORY_CARE_PROVIDER_SITE_OTHER): Payer: Self-pay | Admitting: Podiatrist

## 2023-12-24 VITALS — HR 83 | Temp 97.7°F | Resp 19

## 2023-12-24 DIAGNOSIS — M79674 Pain in right toe(s): Secondary | ICD-10-CM | POA: Insufficient documentation

## 2023-12-24 DIAGNOSIS — M205X2 Other deformities of toe(s) (acquired), left foot: Secondary | ICD-10-CM | POA: Insufficient documentation

## 2023-12-24 DIAGNOSIS — M2041 Other hammer toe(s) (acquired), right foot: Secondary | ICD-10-CM | POA: Insufficient documentation

## 2023-12-24 DIAGNOSIS — M79675 Pain in left toe(s): Secondary | ICD-10-CM | POA: Insufficient documentation

## 2023-12-24 DIAGNOSIS — G609 Hereditary and idiopathic neuropathy, unspecified: Secondary | ICD-10-CM | POA: Insufficient documentation

## 2023-12-24 DIAGNOSIS — M19071 Primary osteoarthritis, right ankle and foot: Secondary | ICD-10-CM | POA: Insufficient documentation

## 2023-12-24 DIAGNOSIS — M2042 Other hammer toe(s) (acquired), left foot: Secondary | ICD-10-CM | POA: Insufficient documentation

## 2023-12-24 DIAGNOSIS — B351 Tinea unguium: Secondary | ICD-10-CM | POA: Insufficient documentation

## 2023-12-24 DIAGNOSIS — M19072 Primary osteoarthritis, left ankle and foot: Secondary | ICD-10-CM | POA: Insufficient documentation

## 2023-12-24 NOTE — Nursing Note (Signed)
 Patient presents today for follow up nail care. Patient states she is not having any pain today. Patient voices no further concerns or complaints at this time. Tandy Gaw, LPN

## 2023-12-24 NOTE — Progress Notes (Signed)
 CC: Fungal toenails     HPI:  This 83 y.o. female with PMH indicated below presents today for fungal nail care. Denies pain at this time, but states that the corn on her left 4th digit has been bothersome. She relates that her toe sleeves help with this but they sometimes move with ambulation. Patient states that her toenails are elongated and thickened, and she is unable to trim them herself. She notes that her right 2nd digit has been bothersome as well. Denies any other pedal complaints.     PCP: Bettyann Bruin, DO   Last Visit: 11/03/2023    Past Medical History:   Diagnosis Date    Past heart attack     take simvastatin     Tremor      Current Outpatient Medications   Medication Sig    cholecalciferol, vitamin D3, 25 mcg (1,000 unit) Oral Tablet Take 1 Tablet (1,000 Units total) by mouth Daily    clonazePAM (KLONOPIN) 0.5 mg Oral Tablet Take 1 Tablet (0.5 mg total) by mouth Daily    diclofenac  sodium (VOLTAREN) 1 % Gel Apply topically Four times a day    estradioL  (ESTRACE ) 0.01 % (0.1 mg/gram) Vaginal Cream Insert 2 g into the vagina Once a day     Allergies   Allergen Reactions    Epinephrine      Nervous/jittery    Cefdinir Nausea/ Vomiting     Past Surgical History:   Procedure Laterality Date    HX CATARACT REMOVAL      TRIGGER FINGER RELEASE       Family Medical History:       Problem Relation (Age of Onset)    Alzheimer's/Dementia Father    Heart Attack Brother, Paternal Grandfather    No Known Problems Sister, Maternal Grandmother, Maternal Grandfather    Ovarian Cancer Mother          Physical examination:   On General Observation: Patient is a pleasant, cooperative, well developed 83 y.o. adult female. The patient is alert and oriented to time, place and person. Patient has normal affect and mood.    Vascular:  DP and PT pulses are palpable. CFT less than 3 seconds to all digits b/l.  Skin temperature is warm to warm from proximal to distal b/l.  Hair growth is absent. No edema noted. Significant  varicosities noted.      Neuro:  Light touch intact b/l. Protective sensation diminished at 3/10 pedal sites via [facility] 5.07 monofilament b/l.  Proprioception intact at the hallux b/l.  No clonus noted. Babinski reflex not elicited b/l.    Derm:  Skin texture and turgor within normal limits.  Toenails 1-5 b/l are thickened, elongated, and discolored with the presence of subungual debris. Webspaces 1-4 clean, dry, intact b/l. No rashes, subcutaneous nodules, or open lesions noted. Hyperkeratotic tissue noted to the lateral left 4th digit. No underlying sores post debridement.     Musc:  Decreased medial longitudinal arch.  Muscle strength is  5/5 for all muscle groups including dorsiflexors, plantarflexors, everters, and inverters b/l.  The 2-4 digit is contracted with a flexion contracture at the PIPJ as well as an extension type contracture at the metatarsophalangeal joint bilateral foot worse on the right than the left. There is discomfort on palpation of the distal tips of digit 2 to the right foot today.  Adductovarus noted to 5th digit with rotation b/l, left > right. No discomfort upon palpation noted. No pain on palpation of  the dorsal interphalangeal joint today.  There is not callus formation noted under the adjacent metatarsophalangeal joint.  The deformity is semi reducible. HAV deformity noted bilaterally. There is no pain over this area.  Ankle joint ROM is decreased b/l, STJ, and MTJ ROM full without pain or crepitus b/l.  No discomfort with palpation of bilateral midfoot noted.     Impression: This is a 83 y.o. 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    Jazmynn was seen today for nail problem.    Diagnoses and all orders for this visit:    Pain in toes of both feet    Onychomycosis    Idiopathic neuropathy          Plan:   The patient was educated on clinical and radiographic  findings, diagnosis and treatment plans. Patient state that she understands all that has been explained and all questions were answered to her apparent satisfaction.     - Patient was examined and evaluated.  - Etiology and tx options were discussed with patient to their understanding.     - Dispensed additional toe sleeves and spacers for continued use. Patient is to continue with toe spacers at all times and is to continue wearing shoes wide enough to accommodate her hammertoes.  - Dispensed crest pads for the right foot with instructions of proper usage.  - Dispensed Pedifix handout for patient to obtain additional offloading with derotation of the 5th digit left foot.   - Continue applying Urea-40 cream as needed.   - Discussed the use of Diclofenac  gel. Patient may apply as prescribed.    - Follow up in 9 weeks     I am scribing for, and in the prescence of, Dr. Bambi Lever for services provided on 12/24/2023  Rudean Corrente, SCRIBE    I personally performed the services described in this documentation, as scribed  in my presence, and it is both accurate  and complete.    Vernia Good, DPM    - Nails 1-5 bilateral were sharply debrided in length and thickness without incident.   - All hyperkeratotic tissue was sharply debrided to the patient's tolerance with a 15 blade.      Vernia Good, DPM

## 2024-02-16 ENCOUNTER — Encounter (INDEPENDENT_AMBULATORY_CARE_PROVIDER_SITE_OTHER): Payer: Self-pay

## 2024-03-03 ENCOUNTER — Ambulatory Visit: Payer: Self-pay | Attending: Podiatrist | Admitting: Podiatrist

## 2024-03-03 ENCOUNTER — Encounter (INDEPENDENT_AMBULATORY_CARE_PROVIDER_SITE_OTHER): Payer: Self-pay | Admitting: Podiatrist

## 2024-03-03 VITALS — HR 94 | Temp 97.3°F | Resp 19

## 2024-03-03 DIAGNOSIS — M2142 Flat foot [pes planus] (acquired), left foot: Secondary | ICD-10-CM | POA: Insufficient documentation

## 2024-03-03 DIAGNOSIS — M79675 Pain in left toe(s): Secondary | ICD-10-CM | POA: Insufficient documentation

## 2024-03-03 DIAGNOSIS — I872 Venous insufficiency (chronic) (peripheral): Secondary | ICD-10-CM | POA: Insufficient documentation

## 2024-03-03 DIAGNOSIS — M79674 Pain in right toe(s): Secondary | ICD-10-CM | POA: Insufficient documentation

## 2024-03-03 DIAGNOSIS — M19071 Primary osteoarthritis, right ankle and foot: Secondary | ICD-10-CM | POA: Insufficient documentation

## 2024-03-03 DIAGNOSIS — M2141 Flat foot [pes planus] (acquired), right foot: Secondary | ICD-10-CM | POA: Insufficient documentation

## 2024-03-03 DIAGNOSIS — M19072 Primary osteoarthritis, left ankle and foot: Secondary | ICD-10-CM | POA: Insufficient documentation

## 2024-03-03 DIAGNOSIS — M2459 Contracture, other specified joint: Secondary | ICD-10-CM | POA: Insufficient documentation

## 2024-03-03 DIAGNOSIS — M2011 Hallux valgus (acquired), right foot: Secondary | ICD-10-CM | POA: Insufficient documentation

## 2024-03-03 DIAGNOSIS — M2012 Hallux valgus (acquired), left foot: Secondary | ICD-10-CM | POA: Insufficient documentation

## 2024-03-03 DIAGNOSIS — G609 Hereditary and idiopathic neuropathy, unspecified: Secondary | ICD-10-CM | POA: Insufficient documentation

## 2024-03-03 DIAGNOSIS — B351 Tinea unguium: Secondary | ICD-10-CM | POA: Insufficient documentation

## 2024-03-03 NOTE — Progress Notes (Signed)
 CC: Fungal toenails     HPI:  This 83 y.o. female with PMH indicated below presents today for fungal nail care. Patient states that her left great toenail has been causing her pain for the last couple of weeks. She believes she may have an ingrown toenail. She states that she has been having increased swelling to both feet but is unsure of the cause. She relates that she usually wears compressions but has not been wearing them lately because of the pain to her left great toenail. Patient states that her toenails are elongated and thickened, and she is unable to trim them herself. Denies any other pedal complaints.     PCP: Bettyann Bruin, DO   Last Visit: 11/03/2023    Past Medical History:   Diagnosis Date    Past heart attack     take simvastatin     Tremor      Current Outpatient Medications   Medication Sig    cholecalciferol, vitamin D3, 25 mcg (1,000 unit) Oral Tablet Take 1 Tablet (1,000 Units total) by mouth Daily    clonazePAM (KLONOPIN) 0.5 mg Oral Tablet Take 1 Tablet (0.5 mg total) by mouth Daily    diclofenac  sodium (VOLTAREN) 1 % Gel Apply topically Four times a day    estradioL  (ESTRACE ) 0.01 % (0.1 mg/gram) Vaginal Cream Insert 2 g into the vagina Once a day     Allergies   Allergen Reactions    Epinephrine      Nervous/jittery    Cefdinir Nausea/ Vomiting     Past Surgical History:   Procedure Laterality Date    HX CATARACT REMOVAL      TRIGGER FINGER RELEASE       Family Medical History:       Problem Relation (Age of Onset)    Alzheimer's/Dementia Father    Heart Attack Brother, Paternal Grandfather    No Known Problems Sister, Maternal Grandmother, Maternal Grandfather    Ovarian Cancer Mother          Physical examination:   On General Observation: Patient is a pleasant, cooperative, well developed 83 y.o. adult female. The patient is alert and oriented to time, place and person. Patient has normal affect and mood.    Vascular:  DP and PT pulses are palpable. CFT less than 3 seconds to all  digits b/l.  Skin temperature is warm to warm from proximal to distal b/l.  Hair growth is absent. No edema noted. Significant varicosities noted.      Neuro:  Light touch intact b/l. Protective sensation diminished at 3/10 pedal sites via [facility] 5.07 monofilament b/l.  Proprioception intact at the hallux b/l.  No clonus noted. Babinski reflex not elicited b/l.    Derm:  Skin texture and turgor within normal limits.  Toenails 1-5 b/l are thickened, elongated, and discolored with the presence of subungual debris. Incurvation to medial border, left hallux. No signs of infection. Webspaces 1-4 clean, dry, intact b/l. No rashes, subcutaneous nodules, or open lesions noted.     Musc:  Decreased medial longitudinal arch.  Muscle strength is  5/5 for all muscle groups including dorsiflexors, plantarflexors, everters, and inverters b/l.  The 2-4 digit is contracted with a flexion contracture at the PIPJ as well as an extension type contracture at the metatarsophalangeal joint bilateral foot worse on the right than the left. There is no pain on palpation of the distal tips of digit 2 to the right foot today.  Adductovarus noted  to 5th digit with rotation b/l, left > right. No discomfort upon palpation noted. No pain on palpation of the dorsal interphalangeal joint today.  There is not callus formation noted under the adjacent metatarsophalangeal joint.  The deformity is semi reducible. HAV deformity noted bilaterally. There is no pain over this area.  Ankle joint ROM is decreased b/l, STJ, and MTJ ROM full without pain or crepitus b/l.  No discomfort with palpation of bilateral midfoot noted.     Impression: This is a 83 y.o. 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    Edia was seen today for toe pain.    Diagnoses and all orders for this visit:    Pain in toes of both feet  -     11721 - DEBRIDEMENT  OF NAIL BY ANY METHOD; 6 OR MORE (AMB ONLY)    Onychomycosis  -     11721 - DEBRIDEMENT OF NAIL BY ANY METHOD; 6 OR MORE (AMB ONLY)    Idiopathic neuropathy  -     11721 - DEBRIDEMENT OF NAIL BY ANY METHOD; 6 OR MORE (AMB ONLY)      Plan:   The patient was educated on clinical and radiographic findings, diagnosis and treatment plans. Patient state that she understands all that has been explained and all questions were answered to her apparent satisfaction.     - Patient was examined and evaluated.  - Etiology and tx options were discussed with patient to their understanding.   - Slant back performed to left hallux without incident.     - Dispensed Tubigrip compressions with instructions of proper usage.    - Patient is to continue with toe spacers at all times and is to continue wearing shoes wide enough to accommodate her hammertoes.  - Patient is to continue with crest pads as instructed.   - Continue applying Urea-40 cream and Diclofenac  gel as needed.   - Dispensed referral to vein clinic.   - Follow up in 9 weeks     I am scribing for, and in the prescence of, Dr. Bambi Lever for services provided on 03/03/2024  Rudean Corrente, SCRIBE    I personally performed the services described in this documentation, as scribed  in my presence, and it is both accurate  and complete.    Vernia Good, DPM    - Nails 1-5 bilateral were sharply debrided in length and thickness without incident.      Vernia Good, DPM

## 2024-03-03 NOTE — Nursing Note (Signed)
 Patient presents today for follow up nail care. Patient states she is not having any pain today. Patient voices no further concerns or complaints at this time. Miquel Amen, LPN

## 2024-03-22 ENCOUNTER — Other Ambulatory Visit (INDEPENDENT_AMBULATORY_CARE_PROVIDER_SITE_OTHER): Payer: Self-pay | Admitting: Nurse Practitioner

## 2024-03-22 DIAGNOSIS — I872 Venous insufficiency (chronic) (peripheral): Secondary | ICD-10-CM

## 2024-05-12 ENCOUNTER — Ambulatory Visit (INDEPENDENT_AMBULATORY_CARE_PROVIDER_SITE_OTHER): Payer: Self-pay | Admitting: Nurse Practitioner

## 2024-05-12 ENCOUNTER — Ambulatory Visit (HOSPITAL_COMMUNITY): Payer: Self-pay

## 2024-05-26 ENCOUNTER — Ambulatory Visit: Payer: Self-pay | Attending: Podiatrist | Admitting: Podiatrist

## 2024-05-26 ENCOUNTER — Encounter (INDEPENDENT_AMBULATORY_CARE_PROVIDER_SITE_OTHER): Payer: Self-pay | Admitting: Podiatrist

## 2024-05-26 VITALS — HR 67 | Temp 97.6°F | Resp 19

## 2024-05-26 DIAGNOSIS — G609 Hereditary and idiopathic neuropathy, unspecified: Secondary | ICD-10-CM | POA: Insufficient documentation

## 2024-05-26 DIAGNOSIS — B351 Tinea unguium: Secondary | ICD-10-CM | POA: Insufficient documentation

## 2024-05-26 DIAGNOSIS — M79674 Pain in right toe(s): Secondary | ICD-10-CM | POA: Insufficient documentation

## 2024-05-26 DIAGNOSIS — M79675 Pain in left toe(s): Secondary | ICD-10-CM | POA: Insufficient documentation

## 2024-05-26 DIAGNOSIS — M2011 Hallux valgus (acquired), right foot: Secondary | ICD-10-CM | POA: Insufficient documentation

## 2024-05-26 DIAGNOSIS — M2040 Other hammer toe(s) (acquired), unspecified foot: Secondary | ICD-10-CM | POA: Insufficient documentation

## 2024-05-26 DIAGNOSIS — L600 Ingrowing nail: Secondary | ICD-10-CM | POA: Insufficient documentation

## 2024-05-26 DIAGNOSIS — I872 Venous insufficiency (chronic) (peripheral): Secondary | ICD-10-CM | POA: Insufficient documentation

## 2024-05-26 DIAGNOSIS — M2012 Hallux valgus (acquired), left foot: Secondary | ICD-10-CM | POA: Insufficient documentation

## 2024-05-26 NOTE — Nursing Note (Signed)
 Patient presents today for follow up nail care. Patient states she is not having any pain today. Patient voices no further concerns or complaints at this time. Miquel Amen, LPN

## 2024-05-26 NOTE — Progress Notes (Signed)
 CC: Fungal toenails     HPI:  This 83 y.o. female with PMH indicated below presents today for fungal nail care. Presents accompanied by her sister in law. Patient endorses recurrent ingrowing to toenail 1 left. Admits to pain. She states that the ingrown toenail keeps coming back and she would like the toenail permanently removed. Patient states that her contracted toes rub. She has tried using her toe spacers but they fall out. She continues to do well with her crest pads. Patient states that her toenails are elongated and thickened, and she is unable to trim them herself. Patient notes decreased swelling from wearing her compression socks. She relates that she had to cancel her vein clinic appt because of family issues but feels as though she does not need one now that her swelling has improved. Denies any other pedal complaints.     PCP: Lorene JULIANNA Hum, DO   Last Visit: 11/03/2023    Past Medical History:   Diagnosis Date    Past heart attack     take simvastatin     Tremor      Current Outpatient Medications   Medication Sig    cholecalciferol, vitamin D3, 25 mcg (1,000 unit) Oral Tablet Take 1 Tablet (1,000 Units total) by mouth Daily    clonazePAM (KLONOPIN) 0.5 mg Oral Tablet Take 1 Tablet (0.5 mg total) by mouth Daily    diclofenac  sodium (VOLTAREN) 1 % Gel Apply topically Four times a day    estradioL  (ESTRACE ) 0.01 % (0.1 mg/gram) Vaginal Cream Insert 2 g into the vagina Once a day     Allergies   Allergen Reactions    Epinephrine      Nervous/jittery    Cefdinir Nausea/ Vomiting     Past Surgical History:   Procedure Laterality Date    HX CATARACT REMOVAL      TRIGGER FINGER RELEASE       Family Medical History:       Problem Relation (Age of Onset)    Alzheimer's/Dementia Father    Heart Attack Brother, Paternal Grandfather    No Known Problems Sister, Maternal Grandmother, Maternal Grandfather    Ovarian Cancer Mother          Physical examination:   On General Observation: Patient is a pleasant,  cooperative, well developed 83 y.o. adult female. The patient is alert and oriented to time, place and person. Patient has normal affect and mood.    Vascular:  DP and PT pulses are palpable. CFT less than 3 seconds to all digits b/l.  Skin temperature is warm to warm from proximal to distal b/l.  Hair growth is absent. No edema noted. Significant varicosities noted.      Neuro:  Light touch intact b/l. Protective sensation diminished at 3/10 pedal sites via [facility] 5.07 monofilament b/l.  Proprioception intact at the hallux b/l.  No clonus noted. Babinski reflex not elicited b/l.    Derm:  Skin texture and turgor within normal limits.  Toenails 1-5 b/l are thickened, elongated, and discolored with the presence of subungual debris. Mild incurvation to medial border, left hallux. No signs of infection. Webspaces 1-4 clean, dry, intact b/l. No rashes, subcutaneous nodules, or open lesions noted. Minimal hyperkeratosis noted to lateral 4th digit left, no underlying sores.     Musc:  Decreased medial longitudinal arch.  Muscle strength is  5/5 for all muscle groups including dorsiflexors, plantarflexors, everters, and inverters b/l.  The 2-4 digit is contracted with a  flexion contracture at the PIPJ as well as an extension type contracture at the metatarsophalangeal joint bilateral foot worse on the right than the left. There is no pain on palpation of the distal tips of digit 2 to the right foot today.  Adductovarus noted to 5th digit with rotation b/l, left > right. No discomfort upon palpation noted. No pain on palpation of the dorsal interphalangeal joint today.  There is not callus formation noted under the adjacent metatarsophalangeal joint.  The deformity is semi reducible. HAV deformity noted bilaterally. There is no pain over this area.  Ankle joint ROM is decreased b/l, STJ, and MTJ ROM full without pain or crepitus b/l.  No discomfort with palpation of bilateral midfoot noted.     Impression: This is  a 83 y.o. 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    Karcyn was seen today for nail problem.    Diagnoses and all orders for this visit:    Pain in toes of both feet  -     11721 - DEBRIDEMENT OF NAIL BY ANY METHOD; 6 OR MORE (AMB ONLY)    Onychomycosis  -     11721 - DEBRIDEMENT OF NAIL BY ANY METHOD; 6 OR MORE (AMB ONLY)    Idiopathic neuropathy  -     11721 - DEBRIDEMENT OF NAIL BY ANY METHOD; 6 OR MORE (AMB ONLY)    Ingrown nail    Venous insufficiency of both lower extremities      Plan:   The patient was educated on clinical and radiographic findings, diagnosis and treatment plans. Patient state that she understands all that has been explained and all questions were answered to her apparent satisfaction.     - Patient was examined and evaluated.  - Etiology and tx options were discussed with patient to their understanding.   - Slant back performed to left hallux without incident.     - Patient is to continue daily compressive therapy with Tubigrip compressions.  - Dispensed Pedifix handout with additional offloading options for patient's hammertoes.   - Patient is to continue with toe spacers at all times and is to continue wearing shoes wide enough to accommodate her hammertoes.  - Patient is to continue with crest pads as instructed.   - Continue applying Urea-40 cream and Diclofenac  gel as needed.   - Plan for permanent matrixectomy of toenail 1 left at next appt.   - Follow up in 6 weeks     I am scribing for, and in the prescence of, Dr. Ozell for services provided on 05/26/2024  Loraine Louder, SCRIBE    I personally performed the services described in this documentation, as scribed  in my presence, and it is both accurate  and complete.    Reyes DELENA Ozell, DPM    - Nails 1-5 bilateral were sharply debrided in length and thickness without incident.    - All hyperkeratotic tissue was  sharply debrided to the patient's tolerance with a 15 blade.      Reyes DELENA Ozell, DPM

## 2024-07-13 ENCOUNTER — Encounter (INDEPENDENT_AMBULATORY_CARE_PROVIDER_SITE_OTHER): Payer: Self-pay | Admitting: Podiatrist

## 2024-07-13 ENCOUNTER — Other Ambulatory Visit: Payer: Self-pay

## 2024-07-13 ENCOUNTER — Ambulatory Visit: Payer: Self-pay | Attending: Podiatrist | Admitting: Podiatrist

## 2024-07-13 VITALS — HR 75 | Temp 96.9°F | Ht 66.0 in | Wt 115.0 lb

## 2024-07-13 DIAGNOSIS — M79674 Pain in right toe(s): Secondary | ICD-10-CM | POA: Insufficient documentation

## 2024-07-13 DIAGNOSIS — L600 Ingrowing nail: Secondary | ICD-10-CM | POA: Insufficient documentation

## 2024-07-13 DIAGNOSIS — B351 Tinea unguium: Secondary | ICD-10-CM | POA: Insufficient documentation

## 2024-07-13 DIAGNOSIS — G609 Hereditary and idiopathic neuropathy, unspecified: Secondary | ICD-10-CM | POA: Insufficient documentation

## 2024-07-13 DIAGNOSIS — M79675 Pain in left toe(s): Secondary | ICD-10-CM | POA: Insufficient documentation

## 2024-07-13 NOTE — Nursing Note (Signed)
 Patient presents to clinic for toe pain and fungus in toes. Patient rates pain in left big toe a 1/10. Patient denies being diabetic. Vernell Rigg, LPN

## 2024-07-13 NOTE — Progress Notes (Signed)
 CC: Fungal toenails     HPI:  This 83 y.o. female with PMH indicated below presents today for fungal nail care. Presents accompanied by her sister in law. Patient endorses continued left great toe pain. She has 1/10 pain today. She states that the ingrown toenail keeps coming back and she would like the toenail permanently removed. Patient states that her contracted toes rub. She has tried using her toe spacers but they fall out. She continues to do well with her crest pads. Patient states that her toenails are elongated and thickened, and she is unable to trim them herself. Patient notes decreased swelling from wearing her compression socks. She relates that she had to cancel her vein clinic appt because of family issues but feels as though she does not need one now that her swelling has improved. Denies any other pedal complaints.     PCP: Lorene JULIANNA Hum, DO   Last Visit: 11/03/2023    Past Medical History:   Diagnosis Date    Past heart attack     take simvastatin     Tremor      Current Outpatient Medications   Medication Sig    cholecalciferol, vitamin D3, 25 mcg (1,000 unit) Oral Tablet Take 1 Tablet (1,000 Units total) by mouth Daily    clonazePAM (KLONOPIN) 0.5 mg Oral Tablet Take 1 Tablet (0.5 mg total) by mouth Daily    diclofenac  sodium (VOLTAREN) 1 % Gel Apply topically Four times a day    estradioL  (ESTRACE ) 0.01 % (0.1 mg/gram) Vaginal Cream Insert 2 g into the vagina Once a day     Allergies   Allergen Reactions    Epinephrine      Nervous/jittery    Cefdinir Nausea/ Vomiting     Past Surgical History:   Procedure Laterality Date    HX CATARACT REMOVAL      TRIGGER FINGER RELEASE       Family Medical History:       Problem Relation (Age of Onset)    Alzheimer's/Dementia Father    Heart Attack Brother, Paternal Grandfather    No Known Problems Sister, Maternal Grandmother, Maternal Grandfather    Ovarian Cancer Mother          Physical examination:   On General Observation: Patient is a pleasant,  cooperative, well developed 83 y.o. adult female. The patient is alert and oriented to time, place and person. Patient has normal affect and mood.    Vascular:  DP and PT pulses are palpable. CFT less than 3 seconds to all digits b/l.  Skin temperature is warm to warm from proximal to distal b/l.  Hair growth is absent. No edema noted. Significant varicosities noted.      Neuro:  Light touch intact b/l. Protective sensation diminished at 3/10 pedal sites via [facility] 5.07 monofilament b/l.  Proprioception intact at the hallux b/l.  No clonus noted. Babinski reflex not elicited b/l.    Derm:  Skin texture and turgor within normal limits.  Toenails 1-5 b/l are thickened, elongated, and discolored with the presence of subungual debris. Mild incurvation to medial border, left hallux. No signs of infection. Webspaces 1-4 clean, dry, intact b/l. No rashes, subcutaneous nodules, or open lesions noted. Minimal hyperkeratosis noted to lateral 4th digit left, no underlying sores.     Musc:  Decreased medial longitudinal arch.  Muscle strength is  5/5 for all muscle groups including dorsiflexors, plantarflexors, everters, and inverters b/l.  The 2-4 digit is contracted with  a flexion contracture at the PIPJ as well as an extension type contracture at the metatarsophalangeal joint bilateral foot worse on the right than the left. There is no pain on palpation of the distal tips of digit 2 to the right foot today.  Adductovarus noted to 5th digit with rotation b/l, left > right. No discomfort upon palpation noted. No pain on palpation of the dorsal interphalangeal joint today.  There is not callus formation noted under the adjacent metatarsophalangeal joint.  The deformity is semi reducible. HAV deformity noted bilaterally. There is no pain over this area.  Ankle joint ROM is decreased b/l, STJ, and MTJ ROM full without pain or crepitus b/l.  No discomfort with palpation of bilateral midfoot noted.     Impression: This is  a 83 y.o. 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    Norris was seen today for toe pain and nail fungus.    Diagnoses and all orders for this visit:    Pain in toes of both feet  -     11721 - DEBRIDEMENT OF NAIL BY ANY METHOD; 6 OR MORE (AMB ONLY)    Onychomycosis  -     11721 - DEBRIDEMENT OF NAIL BY ANY METHOD; 6 OR MORE (AMB ONLY)    Idiopathic neuropathy  -     11721 - DEBRIDEMENT OF NAIL BY ANY METHOD; 6 OR MORE (AMB ONLY)      Plan:   The patient was educated on clinical and radiographic findings, diagnosis and treatment plans. Patient state that she understands all that has been explained and all questions were answered to her apparent satisfaction.     - Patient was examined and evaluated.  - Etiology and tx options were discussed with patient to their understanding.   - Permanent matrixectomy performed to left hallux without incident.     - Patient is to continue daily compressive therapy with Tubigrip compressions.  - Patient is to continue with toe spacers at all times and is to continue wearing shoes wide enough to accommodate her hammertoes.  - Patient is to continue with crest pads as instructed.   - Continue applying Urea-40 cream and Diclofenac  gel as needed.   - Follow up in 2 weeks     I am scribing for, and in the presence of, Dr. Ozell for services provided on 07/13/2024.  Leeroy League, SCRIBE   Leeroy League, SCRIBE  07/13/2024, 14:21      I personally performed the services described in this documentation, as scribed  in my presence, and it is both accurate  and complete.    Reyes DELENA Ozell, DPM

## 2024-07-21 NOTE — Procedures (Signed)
 PODIATRY, BUILDING B  [address]  [place] [id number]  Operated by [facility]  Procedure Note    Name: Courtney [name] MRN:  [id number]   Date: 07/13/2024 DOB:  19-Feb-1941 (83 y.o.)         Nail Removal    Date/Time: 07/21/2024 3:39 PM    Performed by: Ozell Reyes LABOR, DPM  Authorized by: Ozell Reyes LABOR, DPM    Consent:     Consent obtained:  Written    Consent given by:  Patient    Risks, benefits, and alternatives were discussed: yes      Risks discussed:  Bleeding, incomplete removal, infection, pain and permanent nail deformity    Alternatives discussed:  Delayed treatment, observation and alternative treatment  Universal protocol:     Procedure explained and questions answered to patient or proxy's satisfaction: yes      Relevant documents present and verified: yes      Test results available: no      Imaging studies available: no      Required blood products, implants, devices, and special equipment available: no      Site/side marked: yes      Immediately prior to procedure, a time out was called: yes      Patient identity confirmed:  Verbally with patient  Pre-procedure details:     Skin preparation:  Betadine  Procedure details:     Location:  Foot    Foot location:  L big toe  Anesthesia:     Anesthesia method:  Local infiltration    Local anesthetic:  Bupivacaine 0.25% w/o epi and lidocaine  1% w/o epi  Nail Removal:     Nail removed:  Partial    Nail side:  Medial    Nail bed repaired: no      Removed nail replaced and anchored: no    Trephination:     Subungual hematoma drained: no    Ingrown nail:     Wedge excision of skin: no      Nail matrix removed or ablated:  Partial  Post-procedure details:     Dressing:  4x4 sterile gauze and antibiotic ointment    Procedure completion:  Tolerated well, no immediate complications  Comments:      After a discussion of the options, the patient elects to proceed with a permanent nail avulsion procedure of the medial border of the left  hallux today under local digital block. The risks, benefits, potential complications, personnel present, and alternatives were discussed.  The patient elected to proceed. Patient properly identified. Procedure site marked.     A timeout was performed.     A digital block of the left hallux was performed using a total of 5 cc of a 50:50 mixture of 1% lidocaine  plain + 0.25% marcaine plain. A tourniquet was then placed overlying the hallux. After a betadine prep, a blunt elevator was used to free the offending nail border of the left hallux and a longitudinal sectioning was performed using an english anvil. The offending portion of nail plate was avulsed. The nail border was then inspected to insure removal of entire offending nail plate and this was noted to be the case.  A phenol swab was applied to the nail matrix area and nail border for 3 applications of 30 seconds each.  Irrigation was then performed using copious amounts of alcohol and then saline. The tourniquet was then released and capillary refill to the digit was immediate. Bacitracin and dry  sterile dressing was applied. Homegoing instructions were dispensed, including office number.    Reyes DELENA Sharper, DPM         Reyes DELENA Sharper, DPM

## 2024-07-27 ENCOUNTER — Other Ambulatory Visit: Payer: Self-pay

## 2024-07-27 ENCOUNTER — Encounter (INDEPENDENT_AMBULATORY_CARE_PROVIDER_SITE_OTHER): Payer: Self-pay | Admitting: Podiatrist

## 2024-07-27 ENCOUNTER — Ambulatory Visit: Payer: Self-pay | Attending: Podiatrist | Admitting: Podiatrist

## 2024-07-27 VITALS — HR 96 | Temp 97.7°F | Resp 19

## 2024-07-27 DIAGNOSIS — Z9889 Other specified postprocedural states: Secondary | ICD-10-CM

## 2024-07-27 NOTE — Nursing Note (Signed)
 Patient presents today for follow up ingrown toenail. Patient states she is not having any pain today. Patient voices no further concerns or complaints at this time. [place], LPN

## 2024-07-27 NOTE — Progress Notes (Signed)
 Post op nail procedure   Patient presents for f/u of permanent matrixectomy nail procedure left hallux nail. Patient denies any problems or pain. States that her toe is doing very well. Patient performed soaks for 10 days before discontinuing. Has been doing dressing changes as advised. Patient notes that the veins in her feet do not bother her as much since wearing compressions. She asks if she still needs to get her PVR performed now that her veins are less bothersome. Denies any nausea, vomiting, fever, chills. No other complaints.   PCP: Lorene PHEBE Hum, DO  Past Medical History:   Diagnosis Date    Past heart attack     take simvastatin     Tremor      Current Outpatient Medications   Medication Sig    cholecalciferol, vitamin D3, 25 mcg (1,000 unit) Oral Tablet Take 1 Tablet (1,000 Units total) by mouth Daily    clonazePAM (KLONOPIN) 0.5 mg Oral Tablet Take 1 Tablet (0.5 mg total) by mouth Daily    diclofenac  sodium (VOLTAREN) 1 % Gel Apply topically Four times a day    estradioL  (ESTRACE ) 0.01 % (0.1 mg/gram) Vaginal Cream Insert 2 g into the vagina Once a day     Allergies[1]  Objective:   Neurovascular status remains intact and unchanged from prior visit.   Healing wound left hallux nail with fibrous tissue noted in the affected nail border. No evidence of erythema, edema or signs of infection.   Assessment:   S/p permanent matrixectomy nail procedure left hallux nail,  healing satisfactory   Plan:   Removal of fibrotic tissue present in nail fold.   Dispensed additional Tubigrip compressions for continued use.   Discussed with the patient that if the permanent procedure was not successful/procedure was temporary, a new nail will grow within the next 3-4 months. If this occurs and is causing problems, they can return to clinic to attempt another nail procedure at that time if it causes problems.   Follow up in 4 weeks.   Patient was instructed to call immediately if questions, concerns, pain arise.    I am  scribing for, and in the prescence of, Dr. Ozell for services provided on 07/27/2024  Loraine Louder, SCRIBE    I personally performed the services described in this documentation, as scribed  in my presence, and it is both accurate  and complete.    Courtney [name] Ozell, DPM           [1]   Allergies  Allergen Reactions    Epinephrine      Nervous/jittery    Cefdinir Nausea/ Vomiting

## 2024-08-08 ENCOUNTER — Ambulatory Visit
Admission: RE | Admit: 2024-08-08 | Discharge: 2024-08-08 | Disposition: A | Source: Ambulatory Visit | Attending: Neurology

## 2024-08-08 ENCOUNTER — Other Ambulatory Visit (HOSPITAL_COMMUNITY): Payer: Self-pay | Admitting: Neurology

## 2024-08-08 ENCOUNTER — Other Ambulatory Visit: Payer: Self-pay

## 2024-08-08 DIAGNOSIS — M542 Cervicalgia: Secondary | ICD-10-CM

## 2024-08-16 ENCOUNTER — Other Ambulatory Visit (HOSPITAL_COMMUNITY): Payer: Self-pay | Admitting: Neurology

## 2024-08-16 DIAGNOSIS — M542 Cervicalgia: Secondary | ICD-10-CM

## 2024-08-19 ENCOUNTER — Other Ambulatory Visit: Payer: Self-pay

## 2024-08-19 ENCOUNTER — Ambulatory Visit
Admission: RE | Admit: 2024-08-19 | Discharge: 2024-08-19 | Disposition: A | Source: Ambulatory Visit | Attending: Neurology

## 2024-08-19 DIAGNOSIS — M542 Cervicalgia: Secondary | ICD-10-CM | POA: Insufficient documentation

## 2024-09-01 ENCOUNTER — Encounter (INDEPENDENT_AMBULATORY_CARE_PROVIDER_SITE_OTHER): Payer: Self-pay | Admitting: Podiatrist

## 2024-09-01 ENCOUNTER — Ambulatory Visit: Payer: Self-pay | Attending: Podiatrist | Admitting: Podiatrist

## 2024-09-01 VITALS — HR 93 | Temp 96.5°F | Ht 66.0 in | Wt 115.0 lb

## 2024-09-01 DIAGNOSIS — B351 Tinea unguium: Secondary | ICD-10-CM | POA: Insufficient documentation

## 2024-09-01 DIAGNOSIS — I872 Venous insufficiency (chronic) (peripheral): Secondary | ICD-10-CM | POA: Insufficient documentation

## 2024-09-01 DIAGNOSIS — G609 Hereditary and idiopathic neuropathy, unspecified: Secondary | ICD-10-CM | POA: Insufficient documentation

## 2024-09-01 DIAGNOSIS — M205X9 Other deformities of toe(s) (acquired), unspecified foot: Secondary | ICD-10-CM | POA: Insufficient documentation

## 2024-09-01 DIAGNOSIS — Z9889 Other specified postprocedural states: Secondary | ICD-10-CM | POA: Insufficient documentation

## 2024-09-01 DIAGNOSIS — I739 Peripheral vascular disease, unspecified: Secondary | ICD-10-CM | POA: Insufficient documentation

## 2024-09-01 DIAGNOSIS — M79675 Pain in left toe(s): Secondary | ICD-10-CM | POA: Insufficient documentation

## 2024-09-01 DIAGNOSIS — M79674 Pain in right toe(s): Secondary | ICD-10-CM | POA: Insufficient documentation

## 2024-09-01 NOTE — Nursing Note (Signed)
 Patient presents to clinic for follow up on permanent matrixectomy/nail procedure left hallux. Patient denies having any pain or being diabetic. Vernell Rigg, LPN

## 2024-09-01 NOTE — Progress Notes (Signed)
 CC: Fungal toenails with right foot pain    HPI:  This 83 y.o. female with PMH indicated below presents today for fungal toenails with right foot pain. Denies pain at this time. Patient notes irritation to one toe from increased pressure. She relates her left great toe is still doing well from her previous matrixectomy. She has been wearing her Tubigrip compressions and does well with them, stating that they make her feet feel more comfortable. Patient states that her toenails are elongated and thickened, and she is unable to trim them herself. She relates that one toenail is bothersome due to it's thickness. She admits to pain to her 5th toe on the right foot and is unsure if this is rubbing in her shoes. She admits to applying lotion to her callus and states it is not bothersome. Denies any other pedal complaints.     PCP: Lorene JULIANNA Hum, DO   Last Visit: 11/03/2023    Past Medical History:   Diagnosis Date    Past heart attack     take simvastatin     Tremor      Current Outpatient Medications   Medication Sig    cholecalciferol, vitamin D3, 25 mcg (1,000 unit) Oral Tablet Take 1 Tablet (1,000 Units total) by mouth Daily    clonazePAM (KLONOPIN) 0.5 mg Oral Tablet Take 1 Tablet (0.5 mg total) by mouth Daily    diclofenac  sodium (VOLTAREN) 1 % Gel Apply topically Four times a day    estradioL  (ESTRACE ) 0.01 % (0.1 mg/gram) Vaginal Cream Insert 2 g into the vagina Once a day     Allergies   Allergen Reactions    Epinephrine      Nervous/jittery    Cefdinir Nausea/ Vomiting     Past Surgical History:   Procedure Laterality Date    HX CATARACT REMOVAL      TRIGGER FINGER RELEASE       Family Medical History:       Problem Relation (Age of Onset)    Alzheimer's/Dementia Father    Heart Attack Brother, Paternal Grandfather    No Known Problems Sister, Maternal Grandmother, Maternal Grandfather    Ovarian Cancer Mother          Physical examination:   On General Observation: Patient is a pleasant, cooperative, well  developed 83 y.o. adult female. The patient is alert and oriented to time, place and person. Patient has normal affect and mood.    Vascular:  DP and PT pulses are palpable. CFT less than 3 seconds to all digits b/l.  Skin temperature is warm to warm from proximal to distal b/l.  Hair growth is absent. No edema noted. Significant varicosities noted.      Neuro:  Light touch intact b/l. Protective sensation diminished at 3/10 pedal sites via [facility] 5.07 monofilament b/l.  Proprioception intact at the hallux b/l.  No clonus noted. Babinski reflex not elicited b/l.    Derm:  Skin texture and turgor within normal limits.  Toenails 1-5 R and 2-5 L b/l are thickened, elongated, and discolored with the presence of subungual debris. Healed nailbed to the left hallux. Webspaces 1-4 clean, dry, intact b/l. No rashes, subcutaneous nodules, or open lesions noted.     Musc:  Decreased medial longitudinal arch.  Muscle strength is  5/5 for all muscle groups including dorsiflexors, plantarflexors, everters, and inverters b/l.  The 2-4 digit is contracted with a flexion contracture at the PIPJ as well as an extension type  contracture at the metatarsophalangeal joint bilateral foot worse on the right than the left. There is no pain on palpation of the distal tips of digit 2 to the right foot today.  Adductovarus noted to 5th digit with rotation b/l, left > right. No discomfort upon palpation noted. No pain on palpation of the dorsal interphalangeal joint today.  There is not callus formation noted under the adjacent metatarsophalangeal joint.  The deformity is semi reducible. HAV deformity noted bilaterally. There is no pain over this area.  Ankle joint ROM is decreased b/l, STJ, and MTJ ROM full without pain or crepitus b/l.  No discomfort with palpation of bilateral midfoot noted.     Impression: This is a 83 y.o. 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    Brookelynn was seen today for nail problem.    Diagnoses and all orders for this visit:    Pain in toes of both feet  -     11721 - DEBRIDEMENT OF NAIL BY ANY METHOD; 6 OR MORE (AMB ONLY)    Onychomycosis  -     11721 - DEBRIDEMENT OF NAIL BY ANY METHOD; 6 OR MORE (AMB ONLY)    S/P nail surgery    Idiopathic neuropathy  -     11721 - DEBRIDEMENT OF NAIL BY ANY METHOD; 6 OR MORE (AMB ONLY)    Venous insufficiency of both lower extremities    Acquired adductovarus rotation of toe, unspecified laterality    PVD (peripheral vascular disease)      Plan:   The patient was educated on clinical and radiographic findings, diagnosis and treatment plans. Patient state that she understands all that has been explained and all questions were answered to her apparent satisfaction.     - Patient was examined and evaluated.  - Etiology and tx options were discussed with patient to their understanding.    - Patient is to continue daily compressive therapy with Tubigrip compressions.  - Patient is to continue with toe spacers at all times and is to continue wearing shoes wide enough to accommodate her hammertoes.  - Patient is to continue with crest pads as instructed.   - Continue applying Urea-40 cream and Diclofenac  gel as needed.   - Follow up in 9 weeks     I am scribing for, and in the prescence of, Dr. Ozell for services provided on 09/01/2024  Aaryn Johnson, SCRIBE    I personally performed the services described in this documentation, as scribed  in my presence, and it is both accurate  and complete.    Reyes DELENA Ozell, DPM    - Nails 1-5 bilateral were sharply debrided in length and thickness without incident.     Reyes DELENA Ozell, DPM

## 2024-11-03 ENCOUNTER — Encounter (INDEPENDENT_AMBULATORY_CARE_PROVIDER_SITE_OTHER): Payer: Self-pay | Admitting: Family Medicine
